# Patient Record
Sex: Female | Born: 1955 | Race: White | Hispanic: No | Marital: Single | Smoking: Never smoker
Health system: Southern US, Community
[De-identification: ages and names within clinical notes are randomized; demographics above are authoritative.]

## PROBLEM LIST (undated history)

## (undated) DIAGNOSIS — J45909 Unspecified asthma, uncomplicated: Secondary | ICD-10-CM

## (undated) DIAGNOSIS — C801 Malignant (primary) neoplasm, unspecified: Secondary | ICD-10-CM

## (undated) DIAGNOSIS — I10 Essential (primary) hypertension: Secondary | ICD-10-CM

## (undated) HISTORY — PX: BREAST SURGERY: SHX581

---

## 2012-02-08 ENCOUNTER — Emergency Department: Payer: Self-pay | Admitting: Emergency Medicine

## 2016-02-01 ENCOUNTER — Emergency Department: Payer: Medicaid Other

## 2016-02-01 ENCOUNTER — Encounter: Payer: Self-pay | Admitting: Emergency Medicine

## 2016-02-01 ENCOUNTER — Emergency Department
Admission: EM | Admit: 2016-02-01 | Discharge: 2016-02-01 | Disposition: A | Discharge status: Home / Self Care | Payer: Medicaid Other | Attending: Emergency Medicine | Admitting: Emergency Medicine

## 2016-02-01 DIAGNOSIS — I10 Essential (primary) hypertension: Secondary | ICD-10-CM | POA: Diagnosis not present

## 2016-02-01 DIAGNOSIS — I890 Lymphedema, not elsewhere classified: Secondary | ICD-10-CM | POA: Diagnosis not present

## 2016-02-01 DIAGNOSIS — J45909 Unspecified asthma, uncomplicated: Secondary | ICD-10-CM | POA: Diagnosis not present

## 2016-02-01 DIAGNOSIS — R2232 Localized swelling, mass and lump, left upper limb: Secondary | ICD-10-CM | POA: Diagnosis present

## 2016-02-01 HISTORY — DX: Essential (primary) hypertension: I10

## 2016-02-01 HISTORY — DX: Unspecified asthma, uncomplicated: J45.909

## 2016-02-01 LAB — COMPREHENSIVE METABOLIC PANEL
ALK PHOS: 91 U/L (ref 38–126)
ALT: 17 U/L (ref 14–54)
ANION GAP: 4 — AB (ref 5–15)
AST: 34 U/L (ref 15–41)
Albumin: 4 g/dL (ref 3.5–5.0)
BUN: 18 mg/dL (ref 6–20)
CALCIUM: 8.6 mg/dL — AB (ref 8.9–10.3)
CHLORIDE: 106 mmol/L (ref 101–111)
CO2: 27 mmol/L (ref 22–32)
CREATININE: 0.92 mg/dL (ref 0.44–1.00)
Glucose, Bld: 101 mg/dL — ABNORMAL HIGH (ref 65–99)
Potassium: 3.3 mmol/L — ABNORMAL LOW (ref 3.5–5.1)
SODIUM: 137 mmol/L (ref 135–145)
Total Bilirubin: 0.8 mg/dL (ref 0.3–1.2)
Total Protein: 7.6 g/dL (ref 6.5–8.1)

## 2016-02-01 LAB — CBC
HEMATOCRIT: 35.9 % (ref 35.0–47.0)
HEMOGLOBIN: 11.9 g/dL — AB (ref 12.0–16.0)
MCH: 29.4 pg (ref 26.0–34.0)
MCHC: 33 g/dL (ref 32.0–36.0)
MCV: 89 fL (ref 80.0–100.0)
Platelets: 121 10*3/uL — ABNORMAL LOW (ref 150–440)
RBC: 4.04 MIL/uL (ref 3.80–5.20)
RDW: 16.1 % — AB (ref 11.5–14.5)
WBC: 6.9 10*3/uL (ref 3.6–11.0)

## 2016-02-01 MED ORDER — TRAMADOL HCL 50 MG PO TABS: 50.0000 mg | ORAL_TABLET | Freq: Four times a day (QID) | ORAL | Status: DC | PRN

## 2016-02-01 MED ORDER — TRAMADOL HCL 50 MG PO TABS
50.0000 mg | ORAL_TABLET | Freq: Once | ORAL | Status: AC
  Administered 2016-02-01: 50 mg via ORAL
  Filled 2016-02-01: qty 1

## 2016-02-01 NOTE — Discharge Instructions (Signed)
Lymphedema  Lymphedema is swelling that is caused by the abnormal collection of lymph under the skin. Lymph is fluid from the tissues in your body that travels in the lymphatic system. This system is part of the immune system and includes lymph nodes and lymph vessels. The lymph vessels collect and carry the excess fluid, fats, proteins, and wastes from the tissues of the body to the bloodstream. This system also works to clean and remove bacteria and waste products from the body.  Lymphedema occurs when the lymphatic system is blocked. When the lymph vessels or lymph nodes are blocked or damaged, lymph does not drain properly, causing an abnormal buildup of lymph. This leads to swelling in the arms or legs. Lymphedema cannot be cured by medicines, but various methods can be used to help reduce the swelling.  CAUSES  There are two types of lymphedema. Primary lymphedema is caused by the absence or abnormality of the lymph vessel at birth. Secondary lymphedema is more common. It occurs when the lymph vessel is damaged or blocked. Common causes of lymph vessel blockage include:  · Skin infection, such as cellulitis.  · Infection by parasites (filariasis).  · Injury.  · Cancer.  · Radiation therapy.  · Formation of scar tissue.  · Surgery.  SYMPTOMS  Symptoms of this condition include:  · Swelling of the arm or leg.  · A heavy or tight feeling in the arm or leg.  · Swelling of the feet, toes, or fingers. Shoes or rings may fit more tightly than before.  · Redness of the skin over the affected area.  · Limited movement of the affected limb.  · Sensitivity to touch or discomfort in the affected limb.  DIAGNOSIS  This condition may be diagnosed with:  · A physical exam.  · Medical history.  · Imaging tests, such as:    Lymphoscintigraphy. In this test, a low dose of a radioactive substance is injected to trace the flow of lymph through the lymph vessels.    MRI.    CT scan.    Duplex ultrasound. This test uses sound waves  to produce images of the vessels and the blood flow on a screen.    Lymphangiography. In this test, a contrast dye is injected into the lymph vessel to help show blockages.  TREATMENT  Treatment for this condition may depend on the cause. Treatment may include:  · Exercise. Certain exercises can help fluid move out of the affected limb.  · Massage. Gentle massage of the affected limb can help move the fluid out of the area.  · Compression. Various methods may be used to apply pressure to the affected limb in order to reduce the swelling.    Wearing compression stockings or sleeves on the affected limb.    Bandaging the affected limb.    Using an external pump that is attached to a sleeve that alternates between applying pressure and releasing pressure.  · Surgery. This is usually only done for severe cases. For example, surgery may be done if you have trouble moving the limb or if the swelling does not get better with other treatments.  If an underlying condition is causing the lymphedema, treatment for that condition is needed. For example, antibiotic medicines may be used to treat an infection.  HOME CARE INSTRUCTIONS  Activities  · Exercise regularly as directed by your health care provider.  · Do not sit with your legs crossed.  · When possible, keep the affected limb   raised (elevated) above the level of your heart.  · Avoid carrying things with an arm that is affected by lymphedema.  · Remember that the affected area is more likely to become injured or infected.  · Take these steps to help prevent infection:    Keep the affected area clean and dry.    Protect your skin from cuts. For example, you should use gloves while cooking or gardening. Do not walk barefoot. If you shave the affected area, use an electric razor.  General Instructions  · Take medicines only as directed by your health care provider.  · Eat a healthy diet that includes a lot of fruits and vegetables.  · Do not wear tight clothes, shoes, or  jewelry.  · Do not use heating pads over the affected area.  · Avoid having blood pressure checked on the affected limb.  · Keep all follow-up visits as directed by your health care provider. This is important.  SEEK MEDICAL CARE IF:  · You continue to have swelling in your limb.  · You have a fever.  · You have a cut that does not heal.  · You have redness or pain in the affected area.  · You have new swelling in your limb that comes on suddenly.  · You develop purplish spots or sores (lesions) on your limb.  SEEK IMMEDIATE MEDICAL CARE IF:  · You have a skin rash.  · You have chills or sweats.  · You have shortness of breath.     This information is not intended to replace advice given to you by your health care provider. Make sure you discuss any questions you have with your health care provider.     Document Released: 08/17/2007 Document Revised: 03/06/2015 Document Reviewed: 09/27/2014  Elsevier Interactive Patient Education ©2016 [employer]

## 2016-02-01 NOTE — ED Notes (Signed)
Pt to ed with c/o left arm swelling x 6 months.  Pt states her family made her come today.  Reports pain in left arm. +pulse, movement and sensation intact.

## 2016-02-01 NOTE — ED Provider Notes (Signed)
[facility] Emergency Department Provider Note  ____________________________________________    I have reviewed the triage vital signs and the nursing notes.   HISTORY  Chief Complaint Arm Swelling    HPI Charlotte Tran is a 60 y.o. female who presents with complaints of left arm swelling and discomfort. She reports her left arm has been swelling diffusely for approximately the last 6 months. Her family made her come to the emergency room today. She does complain of discomfort diffusely but no areas of redness or discharge. No history of blood clots. No recent travel. No fevers or chills. She does have a history of a left-sided mastectomy     Past Medical History  Diagnosis Date  . Hypertension   . Asthma     There are no active problems to display for this patient.   History reviewed. No pertinent past surgical history.  No current outpatient prescriptions on file.  Allergies Aspirin and Codeine  History reviewed. No pertinent family history.  Social History Social History  Substance Use Topics  . Smoking status: Never Smoker   . Smokeless tobacco: None  . Alcohol Use: Yes    Review of Systems  Constitutional: Negative for fever. Eyes: Negative for redness ENT: Negative for sore throat Cardiovascular: Negative for chest pain Respiratory: Negative for shortness of breath. Gastrointestinal: Negative for abdominal pain Genitourinary: Negative for dysuria. Musculoskeletal: Negative for back pain. Left arm swelling as above Skin: Negative for rash. Neurological: Negative for focal weakness Psychiatric: no anxiety    ____________________________________________   PHYSICAL EXAM:  VITAL SIGNS: ED Triage Vitals  Enc Vitals Group     BP 02/01/16 1334 190/77 mmHg     Pulse Rate 02/01/16 1334 100     Resp 02/01/16 1334 20     Temp 02/01/16 1334 98.4 F (36.9 C)     Temp Source 02/01/16 1334 Oral     SpO2 02/01/16 1334 100 %   Weight 02/01/16 1334 140 lb (63.504 kg)     Height 02/01/16 1334 5\' 6"  (1.676 m)     Head Cir --      Peak Flow --      Pain Score 02/01/16 1335 10     Pain Loc --      Pain Edu? --      Excl. in [place]? --      Constitutional: Alert and oriented. Well appearing and in no distress.  Eyes: Conjunctivae are normal. No erythema or injection ENT   Head: Normocephalic and atraumatic.   Mouth/Throat: Mucous membranes are moist. Cardiovascular: Normal rate, regular rhythm. Normal and symmetric distal pulses are present in the upper extremities. No murmurs or rubs  Respiratory: Normal respiratory effort without tachypnea nor retractions. Breath sounds are clear and equal bilaterally.  Gastrointestinal: Soft and non-tender in all quadrants. No distention. There is no CVA tenderness. Genitourinary: deferred Musculoskeletal: Nontender with normal range of motion in all extremities. No lower extremity tenderness nor edema. Left arm is diffusely edematous. No evidence of cellulitis. 2+ distal pulses, normal cap refill. Compartments are soft. Neurologic:  Normal speech and language. No gross focal neurologic deficits are appreciated. Skin:  Skin is warm, dry and intact. No rash noted. Psychiatric: Mood and affect are normal. Patient exhibits appropriate insight and judgment.  ____________________________________________    LABS (pertinent positives/negatives)  Labs Reviewed  CBC - Abnormal; Notable for the following:    Hemoglobin 11.9 (*)    RDW 16.1 (*)    Platelets 121 (*)  All other components within normal limits  COMPREHENSIVE METABOLIC PANEL - Abnormal; Notable for the following:    Potassium 3.3 (*)    Glucose, Bld 101 (*)    Calcium 8.6 (*)    Anion gap 4 (*)    All other components within normal limits    ____________________________________________   EKG  None  ____________________________________________    RADIOLOGY  Ultrasound of left upper extremity  negative for DVT  ____________________________________________   PROCEDURES  Procedure(s) performed: none  Critical Care performed:none  ____________________________________________   INITIAL IMPRESSION / ASSESSMENT AND PLAN / ED COURSE  Pertinent labs & imaging results that were available during my care of the patient were reviewed by me and considered in my medical decision making (see chart for details).  Given history of mastectomy on the left and gradual worsening of left arm swelling this appears consistent with lymphedema. We will check ultrasound to rule out DVT  ____________________________________________   FINAL CLINICAL IMPRESSION(S) / ED DIAGNOSES  Final diagnoses:  Lymphedema of arm          Lavonia Drafts, MD 02/01/16 2241

## 2016-04-18 ENCOUNTER — Encounter: Payer: Self-pay | Admitting: Emergency Medicine

## 2016-04-18 ENCOUNTER — Emergency Department: Payer: Medicaid Other

## 2016-04-18 ENCOUNTER — Emergency Department
Admission: EM | Admit: 2016-04-18 | Discharge: 2016-04-18 | Disposition: A | Discharge status: Home / Self Care | Payer: Medicaid Other | Attending: Emergency Medicine | Admitting: Emergency Medicine

## 2016-04-18 DIAGNOSIS — J45909 Unspecified asthma, uncomplicated: Secondary | ICD-10-CM | POA: Insufficient documentation

## 2016-04-18 DIAGNOSIS — Z79899 Other long term (current) drug therapy: Secondary | ICD-10-CM | POA: Insufficient documentation

## 2016-04-18 DIAGNOSIS — I10 Essential (primary) hypertension: Secondary | ICD-10-CM | POA: Insufficient documentation

## 2016-04-18 DIAGNOSIS — M25561 Pain in right knee: Secondary | ICD-10-CM | POA: Insufficient documentation

## 2016-04-18 DIAGNOSIS — Z859 Personal history of malignant neoplasm, unspecified: Secondary | ICD-10-CM | POA: Insufficient documentation

## 2016-04-18 DIAGNOSIS — L03114 Cellulitis of left upper limb: Secondary | ICD-10-CM | POA: Insufficient documentation

## 2016-04-18 DIAGNOSIS — R2232 Localized swelling, mass and lump, left upper limb: Secondary | ICD-10-CM | POA: Diagnosis present

## 2016-04-18 DIAGNOSIS — M7989 Other specified soft tissue disorders: Secondary | ICD-10-CM

## 2016-04-18 HISTORY — DX: Malignant (primary) neoplasm, unspecified: C80.1

## 2016-04-18 LAB — CBC WITH DIFFERENTIAL/PLATELET
BASOS ABS: 0.1 10*3/uL (ref 0–0.1)
EOS ABS: 0.2 10*3/uL (ref 0–0.7)
HEMATOCRIT: 40.8 % (ref 35.0–47.0)
Hemoglobin: 13.4 g/dL (ref 12.0–16.0)
Lymphocytes Relative: 17 %
Lymphs Abs: 1.3 10*3/uL (ref 1.0–3.6)
MCH: 30 pg (ref 26.0–34.0)
MCHC: 33 g/dL (ref 32.0–36.0)
MCV: 91.1 fL (ref 80.0–100.0)
MONO ABS: 0.4 10*3/uL (ref 0.2–0.9)
NEUTROS ABS: 5.6 10*3/uL (ref 1.4–6.5)
Neutrophils Relative %: 74 %
PLATELETS: 84 10*3/uL — AB (ref 150–440)
RBC: 4.48 MIL/uL (ref 3.80–5.20)
RDW: 15.1 % — AB (ref 11.5–14.5)
WBC: 7.6 10*3/uL (ref 3.6–11.0)

## 2016-04-18 LAB — URINALYSIS COMPLETE WITH MICROSCOPIC (ARMC ONLY)
BILIRUBIN URINE: NEGATIVE
GLUCOSE, UA: NEGATIVE mg/dL
HGB URINE DIPSTICK: NEGATIVE
KETONES UR: NEGATIVE mg/dL
NITRITE: POSITIVE — AB
Protein, ur: NEGATIVE mg/dL
RBC / HPF: NONE SEEN RBC/hpf (ref 0–5)
Specific Gravity, Urine: 1.006 (ref 1.005–1.030)
pH: 5 (ref 5.0–8.0)

## 2016-04-18 LAB — BASIC METABOLIC PANEL
ANION GAP: 12 (ref 5–15)
BUN: 9 mg/dL (ref 6–20)
CALCIUM: 9.1 mg/dL (ref 8.9–10.3)
CO2: 24 mmol/L (ref 22–32)
CREATININE: 0.94 mg/dL (ref 0.44–1.00)
Chloride: 105 mmol/L (ref 101–111)
Glucose, Bld: 97 mg/dL (ref 65–99)
Potassium: 2.8 mmol/L — CL (ref 3.5–5.1)
Sodium: 141 mmol/L (ref 135–145)

## 2016-04-18 MED ORDER — POTASSIUM CHLORIDE 20 MEQ/15ML (10%) PO SOLN: 40.0000 meq | Freq: Once | ORAL | Status: DC

## 2016-04-18 MED ORDER — POTASSIUM CHLORIDE CRYS ER 20 MEQ PO TBCR
40.0000 meq | EXTENDED_RELEASE_TABLET | Freq: Once | ORAL | Status: AC
  Administered 2016-04-18: 40 meq via ORAL

## 2016-04-18 MED ORDER — POTASSIUM CHLORIDE ER 10 MEQ PO TBCR: 10.0000 meq | EXTENDED_RELEASE_TABLET | Freq: Every day | ORAL | Status: AC

## 2016-04-18 MED ORDER — POTASSIUM CHLORIDE CRYS ER 20 MEQ PO TBCR
EXTENDED_RELEASE_TABLET | ORAL | Status: AC
  Administered 2016-04-18: 40 meq via ORAL
  Filled 2016-04-18: qty 2

## 2016-04-18 MED ORDER — DOXYCYCLINE HYCLATE 100 MG PO CAPS: 100.0000 mg | ORAL_CAPSULE | Freq: Two times a day (BID) | ORAL | Status: DC

## 2016-04-18 MED ORDER — SODIUM CHLORIDE 0.9 % IV BOLUS (SEPSIS)
500.0000 mL | Freq: Once | INTRAVENOUS | Status: AC
  Administered 2016-04-18: 500 mL via INTRAVENOUS

## 2016-04-18 MED ORDER — TRAMADOL HCL 50 MG PO TABS
50.0000 mg | ORAL_TABLET | ORAL | Status: AC
  Administered 2016-04-18: 50 mg via ORAL
  Filled 2016-04-18: qty 1

## 2016-04-18 MED ORDER — TRAMADOL HCL 50 MG PO TABS: 50.0000 mg | ORAL_TABLET | Freq: Four times a day (QID) | ORAL | Status: AC | PRN

## 2016-04-18 NOTE — ED Notes (Signed)
Pt c/o right knee pain, worse with mov't X 2 months. Pt states she could not take pain anymore and came for eval today. Pt hx of fibromyalgia and states that she "moves a lot, walk 5 miles a day" in order to reduce pain from fibromyalgia. Mild swelling to outer right knee present, no redness; tenderness upon palpation. Pt c/o progressive weakness, belives that it is due to knee pain. Pt also has significant swelling to left arm; breast removal X 10 years ago to left side. Pt states she has chronic intermittent swelling to left arm, swollen X 1 month this occurrence, denies pain.

## 2016-04-18 NOTE — ED Notes (Signed)
Report given to Grace RN

## 2016-04-18 NOTE — ED Notes (Signed)
Blistered rash to left chest and becomes more patchy as it goes down her left arm  Pt reports 10/10 right sided knee pain

## 2016-04-18 NOTE — ED Provider Notes (Signed)
[facility] Emergency Department Provider Note  ____________________________________________  Time seen: Approximately 10:22 AM  I have reviewed the triage vital signs and the nursing notes.   HISTORY  Chief Complaint Leg Pain    HPI Charlotte Tran is a 60 y.o. female reports she's been having pain at the lower portion of the right knee for about 2 months. It hurts to walk on and she has to use a cane. She was using tramadol for this, and reports that this does help her. No swelling in the knee. No fevers or chills. No skin changes redness or swelling.  No trauma or injury. States a achy pain, severe when trying to stand upon it. No hip pain, no pain in the lower leg or foot. No numbness or tingling.   Past Medical History  Diagnosis Date  . Hypertension   . Asthma   . Cancer ([place])     There are no active problems to display for this patient.   Past Surgical History  Procedure Laterality Date  . Breast surgery      Current Outpatient Rx  Name  Route  Sig  Dispense  Refill  . lisinopril (PRINIVIL,ZESTRIL) 40 MG tablet   Oral   Take 40 mg by mouth daily.         Marland Kitchen doxycycline (VIBRAMYCIN) 100 MG capsule   Oral   Take 1 capsule (100 mg total) by mouth 2 (two) times daily.   20 capsule   0   . potassium chloride (K-DUR) 10 MEQ tablet   Oral   Take 1 tablet (10 mEq total) by mouth daily.   5 tablet   0   . traMADol (ULTRAM) 50 MG tablet   Oral   Take 1 tablet (50 mg total) by mouth every 6 (six) hours as needed.   30 tablet   0     Allergies Aspirin and Codeine  No family history on file.  Social History Social History  Substance Use Topics  . Smoking status: Never Smoker   . Smokeless tobacco: None  . Alcohol Use: Yes    Review of Systems Constitutional: No fever/chills Eyes: No visual changes. ENT: No sore throat. Cardiovascular: Denies chest pain. Respiratory: Denies shortness of breath. Gastrointestinal: No  abdominal pain.  No nausea, no vomiting.  No diarrhea.  No constipation. Genitourinary: Negative for dysuria. Musculoskeletal: Negative for back pain. Skin: Negative for except the patient noticed over the last month increasing redness over the left upper chest, and also some swelling into the left arm. She's had swelling in the left arm off and on for 10 years, but she reports that this rash appeared after they sprayed for some sort of insect at her boarding house a month or 2 ago. He has been getting red, and she is also noticed that it has some ulcers. Neurological: Negative for headaches, focal weakness or numbness.  10-point ROS otherwise negative.  ____________________________________________   PHYSICAL EXAM:  VITAL SIGNS: ED Triage Vitals  Enc Vitals Group     BP 04/18/16 0903 179/93 mmHg     Pulse Rate 04/18/16 0903 116     Resp 04/18/16 0903 20     Temp 04/18/16 0903 98.6 F (37 C)     Temp Source 04/18/16 0903 Oral     SpO2 04/18/16 0903 99 %     Weight 04/18/16 0903 120 lb (54.432 kg)     Height 04/18/16 0903 5\' 6"  (1.676 m)     Head  Cir --      Peak Flow --      Pain Score 04/18/16 0903 10     Pain Loc --      Pain Edu? --      Excl. in [place]? --    Constitutional: Alert and oriented. Well appearing and in no acute distress.Very pleasant. Eyes: Conjunctivae are normal. PERRL. EOMI. Head: Atraumatic. Nose: No congestion/rhinnorhea. Mouth/Throat: Mucous membranes are moist.  Oropharynx non-erythematous. Neck: No stridor.   Cardiovascular: Normal rate, regular rhythm. Grossly normal heart sounds.  Good peripheral circulation. Respiratory: Normal respiratory effort.  No retractions. Lungs CTAB. Gastrointestinal: Soft and nontender. No distention. No abdominal bruits. No CVA tenderness. Musculoskeletal:   Lower Extremities  No edema. Normal DP/PT pulses bilateral with good cap refill.  Normal neuro-motor function lower extremities bilateral.  RIGHT Right lower  extremity demonstrates normal strength, good use of all muscles. No edema bruising or contusions of the right hip, right knee, right ankle. Full range of motion of the right lower extremity without pain except some over the right lateral knee joint. No pain on axial loading. No evidence of trauma. There is no effusion, overlying skin changes or erythema noted at the right knee. She does have focal tenderness just on the lateral inferior portion of the right knee joint.  LEFT Left lower extremity demonstrates normal strength, good use of all muscles. No edema bruising or contusions of the hip,  knee, ankle. Full range of motion of the left lower extremity without pain. No pain on axial loading. No evidence of trauma.   Neurologic:  Normal speech and language. No gross focal neurologic deficits are appreciated. No gait instability. Skin:  Skin is warm, dry and intact except the left upper extremity has moderate circumferential pitting edema, some mild blanching erythema over the forearm, and then also has a rather tense, shiny, area of erythema and slight pitting over the left upper chest wall, this is also associated with a area about the size of the thumb pad with ulceration which is shallow with extending erythema. Psychiatric: Mood and affect are normal. Speech and behavior are normal.  ____________________________________________   LABS (all labs ordered are listed, but only abnormal results are displayed)  Labs Reviewed  CBC WITH DIFFERENTIAL/PLATELET - Abnormal; Notable for the following:    RDW 15.1 (*)    Platelets 84 (*)    All other components within normal limits  BASIC METABOLIC PANEL - Abnormal; Notable for the following:    Potassium 2.8 (*)    All other components within normal limits  URINALYSIS COMPLETEWITH MICROSCOPIC ([facility] ONLY) - Abnormal; Notable for the following:    Color, Urine COLORLESS (*)    APPearance CLEAR (*)    Nitrite POSITIVE (*)    Leukocytes, UA 2+ (*)     Bacteria, UA FEW (*)    Squamous Epithelial / LPF 0-5 (*)    All other components within normal limits   ____________________________________________  EKG  Reviewed and interpreted by me at 9:15 AM Sinus tachycardia No acute evidence of ischemic abnormality. Heart rate 110 QRS 90 QTc 450  ____________________________________________  RADIOLOGY   DG Knee Complete 4 Views Right (Final result) Result time: 04/18/16 13:05:50   Final result by Rad Results In Interface (04/18/16 13:05:50)   Narrative:   CLINICAL DATA: Progressive pain. No history of recent trauma. History of breast carcinoma  EXAM: RIGHT KNEE - COMPLETE 4+ VIEW  COMPARISON: None.  FINDINGS: Frontal, lateral, and bilateral oblique views were  obtained. There is no fracture or dislocation. No joint effusion. The joint spaces appear normal. No erosive change. No blastic or lytic bone lesions.  IMPRESSION: No fracture or dislocation. No joint effusion. No appreciable arthropathy.   Electronically Signed By: Lowella Grip III M.D. On: 04/18/2016 13:05          DG Chest 2 View (Final result) Result time: 04/18/16 13:09:09   Final result by Rad Results In Interface (04/18/16 13:09:09)   Narrative:   CLINICAL DATA: History of breast carcinoma with left upper extremity edema  EXAM: CHEST 2 VIEW  COMPARISON: February 08, 2012  FINDINGS: There is atelectatic change in the left base. A small focus of pneumonia in the left base cannot be excluded. There is also a rather minimal left pleural effusion. The lungs elsewhere are clear. Heart size and pulmonary vascularity are normal. No adenopathy. Port-A-Cath tip is in the superior vena cava. No pneumothorax. There is arthropathy in the right shoulder.  There is a defect in the right lateral fifth rib of with soft tissue prominence in this area. Patient is status post left mastectomy.  IMPRESSION: There is absence of the lateral right  fifth rib with soft tissue fullness in this area. Neoplastic etiology in this area must be of concern given the current appearance. Patient is status post left mastectomy. Atelectasis with questionable early infiltrate left base. Minimal left pleural effusion. Lungs elsewhere clear. Stable cardiac silhouette.   Electronically Signed By: Lowella Grip III M.D. On: 04/18/2016 13:09          US Venous Img Upper Uni Left (Final result) Result time: 04/18/16 11:33:14   Procedure changed from [place] Extrem Up Left Comp      Final result by Rad Results In Interface (04/18/16 11:33:14)   Narrative:   CLINICAL DATA: Chronic left upper extremity edema. History of previous radiation from left mastectomy  EXAM: LEFT UPPER EXTREMITY VENOUS DUPLEX ULTRASOUND  TECHNIQUE: Gray-scale sonography with graded compression, as well as color Doppler and duplex ultrasound were performed to evaluate the left upper extremity deep venous system from the level of the subclavian vein and including the jugular, axillary, basilic, radial, ulnar and upper cephalic vein. Spectral Doppler was utilized to evaluate flow at rest and with distal augmentation maneuvers.  COMPARISON: February 01, 2016  FINDINGS: Contralateral Subclavian Vein: Respiratory phasicity is normal and symmetric with the symptomatic side. No evidence of thrombus. Normal compressibility.  Internal Jugular Vein: No evidence of thrombus. Normal compressibility, respiratory phasicity and response to augmentation.  Subclavian Vein: No evidence of thrombus. Normal compressibility, respiratory phasicity and response to augmentation.  Axillary Vein: No evidence of thrombus. Normal compressibility, respiratory phasicity and response to augmentation.  Cephalic Vein: No evidence of thrombus. Normal compressibility, respiratory phasicity and response to augmentation.  Basilic Vein: No evidence of thrombus. Normal  compressibility, respiratory phasicity and response to augmentation.  Brachial Veins: No evidence of thrombus. Normal compressibility, respiratory phasicity and response to augmentation.  Radial Veins: No evidence of thrombus. Normal compressibility, respiratory phasicity and response to augmentation.  Ulnar Veins: No evidence of thrombus. Normal compressibility, respiratory phasicity and response to augmentation.  Venous Reflux: None visualized.  Other Findings: None visualized.  IMPRESSION: No evidence of left upper extremity deep venous thrombosis. Right subclavian vein also patent.   Electronically Signed By: Lowella Grip III M.D. On: 04/18/2016 11:33    ____________________________________________   PROCEDURES  Procedure(s) performed: None  Critical Care performed: No  ____________________________________________   INITIAL IMPRESSION / ASSESSMENT AND  PLAN / ED COURSE  Pertinent labs & imaging results that were available during my care of the patient were reviewed by me and considered in my medical decision making (see chart for details).    1) Somewhat chronic right knee pain. Suspect likely due to osteoarthritic changes. No evidence of superinfection, major trauma, or immediate ligamentous or other injury.  2) Patient did not mention as chief complaint, but she has significant edema erythema and a cellulitic-type lesion overlying her left upper chest wall into the arm. Differential includes cellulitis due to skin ulceration, or edema and erythema extending down however also consider the possibility of skin cancer or other malignant process.  ----------------------------------------- 2:35 PM on 04/18/2016 -----------------------------------------  Discussed with the patient that her findings today seem to indicate high concern for worsening cancer which may account for her skin lesions as well. I talked with the patient, she tells me that she was  diagnosed in [place] with stage IV breast cancer, but she never wanted and never received chemotherapy. She is comfortable with this and she reports that she would never want to seek treatment for her cancer. I did discuss with her and she is agreeable to following up with oncology to further evaluate and see if indeed this is cancer that is causing her skin lesion, and she indicates she would be willing to do that but never willing to have surgery, chemotherapy, or radiation. He told her that she should have a discussion with oncology and close follow-up and she agrees.  I will place her on doxycycline as I suspect that there is an element of superinfection overlying her cellulitic and somewhat indurating skin lesion over the left upper arm and chest. I discussed careful return precautions, and even discussed potential admission with the patient but she states that she does not want to stay in the hospital and that she would follow up closely with her doctor. I think this is reasonable, her goals of care seem to be that of not treating any active cancer, and we have given her appropriate follow-up information.  Return precautions and treatment recommendations and follow-up discussed with the patient who is agreeable with the plan.  ____________________________________________   FINAL CLINICAL IMPRESSION(S) / ED DIAGNOSES  Final diagnoses:  Cellulitis of left upper arm  Right knee pain      Delman Kitten, MD 04/18/16 1526

## 2016-04-18 NOTE — ED Notes (Signed)
Patient transported to X-ray 

## 2016-04-18 NOTE — ED Notes (Signed)
Pt complaining of progressive weakness and pain to right leg, pt walks with a cane and finding it difficult to be steady with it .

## 2016-04-18 NOTE — Discharge Instructions (Signed)
You have been seen today in the Emergency Department (ED) for cellulitis, a superficial skin infection. Please take your antibiotics as prescribed for their ENTIRE prescribed duration.    I am concerned that he may have return of your cancer causing your skin eruption, but you indicated that you do not wish any further treatment. I do want you to follow-up with oncology and your primary care doctor soon.  Please follow up with your doctor AND oncology or in the ED in 24-48 hours for recheck of your infection if you are not improving.  Call your doctor sooner or return to the ED if you develop worsening signs of infection such as: increased redness, increased pain, pus, fever, or other symptoms that concern you.   Cellulitis Cellulitis is an infection of the skin and the tissue beneath it. The infected area is usually red and tender. Cellulitis occurs most often in the arms and lower legs.  CAUSES  Cellulitis is caused by bacteria that enter the skin through cracks or cuts in the skin. The most common types of bacteria that cause cellulitis are staphylococci and streptococci. SIGNS AND SYMPTOMS   Redness and warmth.  Swelling.  Tenderness or pain.  Fever. DIAGNOSIS  Your health care provider can usually determine what is wrong based on a physical exam. Blood tests may also be done. TREATMENT  Treatment usually involves taking an antibiotic medicine. HOME CARE INSTRUCTIONS   Take your antibiotic medicine as directed by your health care provider. Finish the antibiotic even if you start to feel better.  Keep the infected arm or leg elevated to reduce swelling.  Apply a warm cloth to the affected area up to 4 times per day to relieve pain.  Take medicines only as directed by your health care provider.  Keep all follow-up visits as directed by your health care provider. SEEK MEDICAL CARE IF:   You notice red streaks coming from the infected area.  Your red area gets larger or turns  dark in color.  Your bone or joint underneath the infected area becomes painful after the skin has healed.  Your infection returns in the same area or another area.  You notice a swollen bump in the infected area.  You develop new symptoms.  You have a fever. SEEK IMMEDIATE MEDICAL CARE IF:   You feel very sleepy.  You develop vomiting or diarrhea.  You have a general ill feeling (malaise) with muscle aches and pains.   This information is not intended to replace advice given to you by your health care provider. Make sure you discuss any questions you have with your health care provider.   Document Released: 07/30/2005 Document Revised: 07/11/2015 Document Reviewed: 01/05/2012 Elsevier Interactive Patient Education [employer].

## 2016-06-09 ENCOUNTER — Encounter: Payer: Self-pay | Admitting: Emergency Medicine

## 2016-06-09 ENCOUNTER — Emergency Department: Payer: Medicaid Other

## 2016-06-09 ENCOUNTER — Inpatient Hospital Stay
Admission: EM | Admit: 2016-06-09 | Discharge: 2016-07-04 | DRG: 682 | Disposition: E | Payer: Medicaid Other | Attending: Internal Medicine | Admitting: Internal Medicine

## 2016-06-09 DIAGNOSIS — L989 Disorder of the skin and subcutaneous tissue, unspecified: Secondary | ICD-10-CM

## 2016-06-09 DIAGNOSIS — Z9221 Personal history of antineoplastic chemotherapy: Secondary | ICD-10-CM

## 2016-06-09 DIAGNOSIS — R000 Tachycardia, unspecified: Secondary | ICD-10-CM | POA: Diagnosis present

## 2016-06-09 DIAGNOSIS — J9600 Acute respiratory failure, unspecified whether with hypoxia or hypercapnia: Secondary | ICD-10-CM | POA: Diagnosis present

## 2016-06-09 DIAGNOSIS — L539 Erythematous condition, unspecified: Secondary | ICD-10-CM | POA: Diagnosis present

## 2016-06-09 DIAGNOSIS — I890 Lymphedema, not elsewhere classified: Secondary | ICD-10-CM | POA: Diagnosis present

## 2016-06-09 DIAGNOSIS — Z515 Encounter for palliative care: Secondary | ICD-10-CM

## 2016-06-09 DIAGNOSIS — N390 Urinary tract infection, site not specified: Secondary | ICD-10-CM | POA: Diagnosis present

## 2016-06-09 DIAGNOSIS — J45909 Unspecified asthma, uncomplicated: Secondary | ICD-10-CM | POA: Diagnosis present

## 2016-06-09 DIAGNOSIS — I10 Essential (primary) hypertension: Secondary | ICD-10-CM | POA: Diagnosis present

## 2016-06-09 DIAGNOSIS — C50919 Malignant neoplasm of unspecified site of unspecified female breast: Secondary | ICD-10-CM | POA: Diagnosis present

## 2016-06-09 DIAGNOSIS — J189 Pneumonia, unspecified organism: Secondary | ICD-10-CM | POA: Diagnosis present

## 2016-06-09 DIAGNOSIS — Y842 Radiological procedure and radiotherapy as the cause of abnormal reaction of the patient, or of later complication, without mention of misadventure at the time of the procedure: Secondary | ICD-10-CM | POA: Diagnosis present

## 2016-06-09 DIAGNOSIS — Z923 Personal history of irradiation: Secondary | ICD-10-CM

## 2016-06-09 DIAGNOSIS — L89322 Pressure ulcer of left buttock, stage 2: Secondary | ICD-10-CM | POA: Diagnosis present

## 2016-06-09 DIAGNOSIS — R0902 Hypoxemia: Secondary | ICD-10-CM | POA: Diagnosis present

## 2016-06-09 DIAGNOSIS — R0602 Shortness of breath: Secondary | ICD-10-CM | POA: Diagnosis not present

## 2016-06-09 DIAGNOSIS — N170 Acute kidney failure with tubular necrosis: Principal | ICD-10-CM | POA: Diagnosis present

## 2016-06-09 DIAGNOSIS — Z886 Allergy status to analgesic agent status: Secondary | ICD-10-CM | POA: Diagnosis not present

## 2016-06-09 DIAGNOSIS — E860 Dehydration: Secondary | ICD-10-CM | POA: Diagnosis present

## 2016-06-09 DIAGNOSIS — Z9010 Acquired absence of unspecified breast and nipple: Secondary | ICD-10-CM | POA: Diagnosis not present

## 2016-06-09 DIAGNOSIS — N179 Acute kidney failure, unspecified: Secondary | ICD-10-CM | POA: Diagnosis not present

## 2016-06-09 DIAGNOSIS — M797 Fibromyalgia: Secondary | ICD-10-CM | POA: Diagnosis present

## 2016-06-09 DIAGNOSIS — L309 Dermatitis, unspecified: Secondary | ICD-10-CM | POA: Diagnosis present

## 2016-06-09 DIAGNOSIS — Z79899 Other long term (current) drug therapy: Secondary | ICD-10-CM

## 2016-06-09 DIAGNOSIS — Z66 Do not resuscitate: Secondary | ICD-10-CM | POA: Diagnosis present

## 2016-06-09 DIAGNOSIS — G8929 Other chronic pain: Secondary | ICD-10-CM | POA: Diagnosis present

## 2016-06-09 DIAGNOSIS — L8990 Pressure ulcer of unspecified site, unspecified stage: Secondary | ICD-10-CM | POA: Diagnosis present

## 2016-06-09 LAB — CBC WITH DIFFERENTIAL/PLATELET
Basophils Absolute: 0 10*3/uL (ref 0–0.1)
Basophils Relative: 1 %
Eosinophils Absolute: 0 10*3/uL (ref 0–0.7)
Eosinophils Relative: 0 %
HCT: 45.6 % (ref 35.0–47.0)
HEMOGLOBIN: 15.4 g/dL (ref 12.0–16.0)
LYMPHS ABS: 1.1 10*3/uL (ref 1.0–3.6)
LYMPHS PCT: 16 %
MCH: 31.1 pg (ref 26.0–34.0)
MCHC: 33.9 g/dL (ref 32.0–36.0)
MCV: 91.6 fL (ref 80.0–100.0)
Monocytes Absolute: 0.5 10*3/uL (ref 0.2–0.9)
Monocytes Relative: 8 %
NEUTROS ABS: 5.2 10*3/uL (ref 1.4–6.5)
NEUTROS PCT: 75 %
Platelets: 171 10*3/uL (ref 150–440)
RBC: 4.97 MIL/uL (ref 3.80–5.20)
RDW: 17.2 % — ABNORMAL HIGH (ref 11.5–14.5)
WBC: 6.9 10*3/uL (ref 3.6–11.0)

## 2016-06-09 LAB — COMPREHENSIVE METABOLIC PANEL
ALK PHOS: 287 U/L — AB (ref 38–126)
ALT: 57 U/L — AB (ref 14–54)
AST: 138 U/L — ABNORMAL HIGH (ref 15–41)
Albumin: 3.6 g/dL (ref 3.5–5.0)
Anion gap: 15 (ref 5–15)
BUN: 48 mg/dL — ABNORMAL HIGH (ref 6–20)
CO2: 19 mmol/L — AB (ref 22–32)
CREATININE: 2.08 mg/dL — AB (ref 0.44–1.00)
Calcium: 9.9 mg/dL (ref 8.9–10.3)
Chloride: 104 mmol/L (ref 101–111)
GFR calc non Af Amer: 24 mL/min — ABNORMAL LOW (ref >60)
GFR, EST AFRICAN AMERICAN: 28 mL/min — AB (ref >60)
Glucose, Bld: 119 mg/dL — ABNORMAL HIGH (ref 65–99)
Potassium: 3.8 mmol/L (ref 3.5–5.1)
SODIUM: 138 mmol/L (ref 135–145)
Total Bilirubin: 0.9 mg/dL (ref 0.3–1.2)
Total Protein: 8.3 g/dL — ABNORMAL HIGH (ref 6.5–8.1)

## 2016-06-09 LAB — URINALYSIS COMPLETE WITH MICROSCOPIC (ARMC ONLY)
BACTERIA UA: NONE SEEN
Bilirubin Urine: NEGATIVE
Glucose, UA: 50 mg/dL — AB
KETONES UR: NEGATIVE mg/dL
Nitrite: NEGATIVE
PH: 5 (ref 5.0–8.0)
PROTEIN: 100 mg/dL — AB
SPECIFIC GRAVITY, URINE: 1.013 (ref 1.005–1.030)

## 2016-06-09 LAB — TROPONIN I: TROPONIN I: 0.04 ng/mL — AB (ref <0.03)

## 2016-06-09 MED ORDER — SODIUM CHLORIDE 0.9 % IV SOLN
1000.0000 mL | Freq: Once | INTRAVENOUS | Status: AC
  Administered 2016-06-09: 1000 mL via INTRAVENOUS

## 2016-06-09 MED ORDER — ENOXAPARIN SODIUM 60 MG/0.6ML ~~LOC~~ SOLN
55.0000 mg | SUBCUTANEOUS | Status: DC
  Filled 2016-06-09 (×2): qty 0.6

## 2016-06-09 MED ORDER — ACETAMINOPHEN 650 MG RE SUPP: 650.0000 mg | Freq: Four times a day (QID) | RECTAL | Status: DC | PRN

## 2016-06-09 MED ORDER — ACETAMINOPHEN 325 MG PO TABS: 650.0000 mg | ORAL_TABLET | Freq: Four times a day (QID) | ORAL | Status: DC | PRN

## 2016-06-09 MED ORDER — BISACODYL 5 MG PO TBEC
5.0000 mg | DELAYED_RELEASE_TABLET | Freq: Every day | ORAL | Status: DC | PRN
  Administered 2016-06-11: 5 mg via ORAL
  Filled 2016-06-09: qty 1

## 2016-06-09 MED ORDER — HEPARIN SODIUM (PORCINE) 5000 UNIT/ML IJ SOLN: 5000.0000 [IU] | Freq: Three times a day (TID) | INTRAMUSCULAR | Status: DC

## 2016-06-09 MED ORDER — ONDANSETRON HCL 4 MG/2ML IJ SOLN: 4.0000 mg | Freq: Four times a day (QID) | INTRAMUSCULAR | Status: DC | PRN

## 2016-06-09 MED ORDER — TRAMADOL HCL 50 MG PO TABS
50.0000 mg | ORAL_TABLET | Freq: Four times a day (QID) | ORAL | Status: DC | PRN
  Administered 2016-06-09 – 2016-06-11 (×5): 50 mg via ORAL
  Filled 2016-06-09 (×5): qty 1

## 2016-06-09 MED ORDER — DEXTROSE 5 % IV SOLN
1.0000 g | INTRAVENOUS | Status: DC
  Administered 2016-06-09 – 2016-06-10 (×2): 1 g via INTRAVENOUS
  Filled 2016-06-09 (×3): qty 10

## 2016-06-09 MED ORDER — ONDANSETRON HCL 4 MG PO TABS: 4.0000 mg | ORAL_TABLET | Freq: Four times a day (QID) | ORAL | Status: DC | PRN

## 2016-06-09 MED ORDER — SODIUM CHLORIDE 0.9 % IV SOLN
INTRAVENOUS | Status: DC
  Administered 2016-06-09: 18:00:00 via INTRAVENOUS

## 2016-06-09 MED ORDER — TRAZODONE HCL 50 MG PO TABS: 25.0000 mg | ORAL_TABLET | Freq: Every evening | ORAL | Status: DC | PRN

## 2016-06-09 MED ORDER — DOCUSATE SODIUM 100 MG PO CAPS
100.0000 mg | ORAL_CAPSULE | Freq: Two times a day (BID) | ORAL | Status: DC
  Administered 2016-06-09 – 2016-06-11 (×4): 100 mg via ORAL
  Filled 2016-06-09 (×4): qty 1

## 2016-06-09 MED ORDER — DEXTROSE 5 % IV SOLN
500.0000 mg | INTRAVENOUS | Status: DC
  Administered 2016-06-09 – 2016-06-10 (×2): 500 mg via INTRAVENOUS
  Filled 2016-06-09 (×3): qty 500

## 2016-06-09 NOTE — ED Notes (Signed)
Patient will be ordered a regular diet per admitting MD. Patient given sandwich and drink while we wait for bed assignment.

## 2016-06-09 NOTE — ED Triage Notes (Signed)
Pt presents with open wounds to left upper chest area with severe swelling to left arm.

## 2016-06-09 NOTE — Progress Notes (Signed)
ANTICOAGULATION CONSULT NOTE - Initial Consult  Pharmacy Consult for Lovenox  Indication: possible PE  Allergies  Allergen Reactions  . Aspirin Anaphylaxis  . Codeine Anaphylaxis    Patient Measurements: Height: 5\' 6"  (167.6 cm) Weight: 118 lb 14.4 oz (53.9 kg) IBW/kg (Calculated) : 59.3 Heparin Dosing Weight:   Vital Signs: Temp: 98.1 F (36.7 C) (08/07 1743) Temp Source: Oral (08/07 1743) BP: 138/86 (08/07 1743) Pulse Rate: 116 (08/07 1743)  Labs:  Recent Labs  06/21/2016 1223  HGB 15.4  HCT 45.6  PLT 171  CREATININE 2.08*  TROPONINI 0.04*    Estimated Creatinine Clearance: 23.9 mL/min (by C-G formula based on SCr of 2.08 mg/dL).   Medical History: Past Medical History:  Diagnosis Date  . Asthma   . Cancer ([place])   . Hypertension     Medications:  Prescriptions Prior to Admission  Medication Sig Dispense Refill Last Dose  . lisinopril (PRINIVIL,ZESTRIL) 40 MG tablet Take 40 mg by mouth daily.   unknown at unknown  . potassium chloride (K-DUR) 10 MEQ tablet Take 1 tablet (10 mEq total) by mouth daily. 5 tablet 0 unknown at unknown  . traMADol (ULTRAM) 50 MG tablet Take 1 tablet (50 mg total) by mouth every 6 (six) hours as needed. 30 tablet 0 unknown at unknown    Assessment: CrCl = 23.9 ml/min TBW = 53.9 kg   Goal of Therapy:  resolution of PE Monitor platelets by anticoagulation protocol: Yes   Plan:  Lovenox 60 mg SQ Q12H originally ordered.  Will adjust dose to lovenox 55 mg SQ Q24H based on CrCl < 30 ml/min.   Zanae Kuehnle D 06/28/2016,6:33 PM

## 2016-06-09 NOTE — ED Notes (Signed)
Patient transported to X-ray 

## 2016-06-09 NOTE — ED Provider Notes (Signed)
[facility] Emergency Department Provider Note   ____________________________________________    I have reviewed the triage vital signs and the nursing notes.   HISTORY  Chief Complaint Cellulitis   HPI Charlotte Tran is a 60 y.o. female who is brought in by family for weakness. Patient lives alone. Family lives in [place]. Patient has had increasing difficulty caring for herself. She has a history of breast cancer status post mastectomy 9 years ago. She has noted redness and pain to her left anterior chest and shoulder she has had erythema extending away from the area as well. She denies fevers but has felt short of breath and weak all over. She denies chest pain. No nausea or vomiting. She is a history of lymphedema in the left extremity   Past Medical History:  Diagnosis Date  . Asthma   . Cancer ([place])   . Hypertension     There are no active problems to display for this patient.   Past Surgical History:  Procedure Laterality Date  . BREAST SURGERY      Prior to Admission medications   Medication Sig Start Date End Date Taking? Authorizing Provider  doxycycline (VIBRAMYCIN) 100 MG capsule Take 1 capsule (100 mg total) by mouth 2 (two) times daily. 04/18/16   Delman Kitten, MD  lisinopril (PRINIVIL,ZESTRIL) 40 MG tablet Take 40 mg by mouth daily.    Historical Provider, MD  potassium chloride (K-DUR) 10 MEQ tablet Take 1 tablet (10 mEq total) by mouth daily. 04/18/16   Delman Kitten, MD  traMADol (ULTRAM) 50 MG tablet Take 1 tablet (50 mg total) by mouth every 6 (six) hours as needed. 04/18/16   Delman Kitten, MD     Allergies Aspirin and Codeine  No family history on file.  Social History Social History  Substance Use Topics  . Smoking status: Never Smoker  . Smokeless tobacco: Never Used  . Alcohol use Yes    Review of Systems  Constitutional: No fever/chills Eyes: No visual changes.  ENT: No sore throat. Cardiovascular: Denies chest  pain. Respiratory: Mild shortness of breath Gastrointestinal: No abdominal pain.  No nausea, no vomiting.   Genitourinary: Negative for dysuria. Musculoskeletal: Negative for back pain. Skin: Positive for rash Neurological: Diffuse weakness  10-point ROS otherwise negative.  ____________________________________________   PHYSICAL EXAM:  VITAL SIGNS: ED Triage Vitals  Enc Vitals Group     BP 06/27/2016 1137 125/85     Pulse Rate 06/04/2016 1137 (!) 122     Resp 07/01/2016 1137 19     Temp 06/11/2016 1137 97.6 F (36.4 C)     Temp Source 06/03/2016 1137 Oral     SpO2 06/17/2016 1137 93 %     Weight 06/16/2016 1137 133 lb (60.3 kg)     Height 06/06/2016 1137 5\' 6"  (1.676 m)     Head Circumference --      Peak Flow --      Pain Score 06/05/2016 1152 8     Pain Loc --      Pain Edu? --      Excl. in [place]? --     Constitutional: Alert and oriented. Chronically ill-appearing Eyes: Conjunctivae are normal.  Head: Atraumatic. Nose: No congestion/rhinnorhea. Mouth/Throat: Mucous membranes are moist.   Neck:  Painless ROM Cardiovascular: Tachycardia, regular rhythm. Grossly normal heart sounds.  Good peripheral circulation. Respiratory: Increased respiratory effort with tachypnea.  No retractions. Gastrointestinal: Soft and nontender. No distention.  No CVA tenderness. Genitourinary: deferred Musculoskeletal:  Left arm lymphedema, 2+ distal pulses. Neurologic:  Normal speech and language. No gross focal neurologic deficits are appreciated.  Skin:  Skin is warm, dry. Patient with large area of nodular erythematous lesion to the left superior anterior chest extending onto the back with central ulceration Psychiatric: Mood and affect are normal. Speech and behavior are normal.  ____________________________________________   LABS (all labs ordered are listed, but only abnormal results are displayed)  Labs Reviewed  CBC WITH DIFFERENTIAL/PLATELET - Abnormal; Notable for the following:        Result Value   RDW 17.2 (*)    All other components within normal limits  COMPREHENSIVE METABOLIC PANEL - Abnormal; Notable for the following:    CO2 19 (*)    Glucose, Bld 119 (*)    BUN 48 (*)    Creatinine, Ser 2.08 (*)    Total Protein 8.3 (*)    AST 138 (*)    ALT 57 (*)    Alkaline Phosphatase 287 (*)    GFR calc non Af Amer 24 (*)    GFR calc Af Amer 28 (*)    All other components within normal limits  URINALYSIS COMPLETEWITH MICROSCOPIC ([facility] ONLY)  TROPONIN I   ____________________________________________  EKG  ED ECG REPORT I, Lavonia Drafts, the attending physician, personally viewed and interpreted this ECG.  Date: 06/07/2016 EKG Time: 2:09 PM Rate: 106 Rhythm: Sinus tachycardia QRS Axis: normal Intervals: normal ST/T Wave abnormalities: normal Conduction Disturbances: none   ____________________________________________  RADIOLOGY  Chest x-ray abnormal please see report ____________________________________________   PROCEDURES  Procedure(s) performed: No    Critical Care performed:No ____________________________________________   INITIAL IMPRESSION / ASSESSMENT AND PLAN / ED COURSE  Pertinent labs & imaging results that were available during my care of the patient were reviewed by me and considered in my medical decision making (see chart for details).  Patient presents with diffuse weakness, family reports inability to care for self. She has concerning lesion to the left anterior chest especially given her history of breast cancer. Her creatinine is elevated Consistent with acute renal failure, she is tachycardic and hypoxic and I'm suspicious of PE but unable to obtain CT angiography at this time given her GFR, we will hydrate and admit to the hospital for further workup  Clinical Course  Mildly elevated troponin, EKG unremarkable. Chest x-ray concerning especially given skin lesion for metastatic  disease ____________________________________________   FINAL CLINICAL IMPRESSION(S) / ED DIAGNOSES  Final diagnoses:  Acute renal failure, unspecified acute renal failure type (HCC)  Skin lesion of chest wall  Lymphedema  Shortness of breath      NEW MEDICATIONS STARTED DURING THIS VISIT:  New Prescriptions   No medications on file     Note:  This document was prepared using Dragon voice recognition software and may include unintentional dictation errors.    Lavonia Drafts, MD 06/10/2016 [phone]

## 2016-06-09 NOTE — H&P (Signed)
[facility] [place] NAME: Charlotte Tran    MR#:  [id number]  DATE OF BIRTH:  1956-06-04  DATE OF ADMISSION:  06/27/2016  PRIMARY CARE PHYSICIAN: [facility]   REQUESTING/REFERRING PHYSICIAN: Dr. Corky Tran  CHIEF COMPLAINT:  Left Arm edema    Chief Complaint  Patient presents with  . Cellulitis    HISTORY OF PRESENT ILLNESS:  Charlotte Tran  is a 60 y.o. female with a known history of Hypertension, stage IV breast cancer history of chemotherapy, radiation, left mastectomy 9 years ago comes in with worsening edema of the left hand associated with shortness of breath. Patient was advised to follow-up with oncologist for further treatment options but she refused. Any treatment. Patient has stage IV breast cancer and had a treatment in [place]. Moved from [place] 7 years ago. Patient does not want any further treatments for the breast cancer. And she refused oncology consult also. We are admitting her for acute renal failure. 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 Patient complains of some cough, chronic pain. Denies nausea, vomiting, diarrhea.  PAST MEDICAL HISTORY:   Past Medical History:  Diagnosis Date  . Asthma   . Cancer ([place])   . Hypertension     PAST SURGICAL HISTOIRY:   Past Surgical History:  Procedure Laterality Date  . BREAST SURGERY      SOCIAL HISTORY:   Social History  Substance Use Topics  . Smoking status: Never Smoker  . Smokeless tobacco: Never Used  . Alcohol use Yes    FAMILY HISTORY:  No family history on file.  DRUG ALLERGIES:   Allergies  Allergen Reactions  . Aspirin Anaphylaxis  . Codeine Anaphylaxis    REVIEW OF SYSTEMS:  CONSTITUTIONAL: No fever, fatigue or weakness.  EYES: No blurred or double vision.  EARS, NOSE, AND THROAT: No tinnitus or ear  pain.  RESPIRATORY:Complains of cough, shortness of breath.no  wheezing or hemoptysis.  CARDIOVASCULAR: No chest pain, orthopnea, edema.  GASTROINTESTINAL: No nausea, vomiting, diarrhea or abdominal pain.  GENITOURINARY: No dysuria, hematuria.  ENDOCRINE: No polyuria, nocturia,  HEMATOLOGY: No anemia, easy bruising or bleeding SKIN: No rash or lesion. MUSCULOSKELETAL;left arm edema NEUROLOGIC: No tingling, numbness, weakness.  PSYCHIATRY: No anxiety or depression.   MEDICATIONS AT HOME:   Prior to Admission medications   Medication Sig Start Date End Date Taking? Authorizing Provider  lisinopril (PRINIVIL,ZESTRIL) 40 MG tablet Take 40 mg by mouth daily.   Yes Historical Provider, MD  potassium chloride (K-DUR) 10 MEQ tablet Take 1 tablet (10 mEq total) by mouth daily. 04/18/16  Yes Charlotte Kitten, MD  traMADol (ULTRAM) 50 MG tablet Take 1 tablet (50 mg total) by mouth every 6 (six) hours as needed. 04/18/16  Yes Charlotte Kitten, MD      VITAL SIGNS:  Blood pressure 109/83, pulse (!) 103, temperature 97.6 F (36.4 C), temperature source Oral, resp. rate (!) 26, height 5\' 6"  (1.676 m), weight 60.3 kg (133 lb), SpO2 96 %.  PHYSICAL EXAMINATION:  GENERAL:  60 y.o.-year-old patient lying in the bed with no acute distress.  EYES: Pupils equal, round, reactive to light and accommodation. No scleral icterus. Extraocular muscles intact.  HEENT: Head atraumatic, normocephalic. Oropharynx and nasopharynx clear.  NECK:  Supple, no jugular venous distention. No thyroid enlargement, no tenderness.  LUNGS: Normal breath sounds bilaterally, no wheezing,  rales,rhonchi or crepitation. No use of accessory muscles of respiration.  CARDIOVASCULAR: S1, S2 normal. No murmurs, rubs, or gallops.  ABDOMEN: Soft, nontender, nondistended. Bowel sounds present. No organomegaly or mass.  EXTREMITIES: Left arm edema present. No edema or cyanosis in the legs. NEUROLOGIC: Cranial nerves II through XII are intact. Muscle  strength 5/5 in all extremities. Sensation intact. Gait not checked.  PSYCHIATRIC: The patient is alert and oriented x 3.  SKIN:. And has large area of erythematous rash in the left anterior chest extending into the back with central ulcers. Patient says that she has chronic rash since the radiation therapy 9 years ago.  LABORATORY PANEL:   CBC  Recent Labs Lab 06/14/2016 1223  WBC 6.9  HGB 15.4  HCT 45.6  PLT 171   ------------------------------------------------------------------------------------------------------------------  Chemistries   Recent Labs Lab 06/15/2016 1223  NA 138  K 3.8  CL 104  CO2 19*  GLUCOSE 119*  BUN 48*  CREATININE 2.08*  CALCIUM 9.9  AST 138*  ALT 57*  ALKPHOS 287*  BILITOT 0.9   ------------------------------------------------------------------------------------------------------------------  Cardiac Enzymes  Recent Labs Lab 06/26/2016 1223  TROPONINI 0.04*   ------------------------------------------------------------------------------------------------------------------  RADIOLOGY:  Dg Chest 2 View  Result Date: 06/23/2016 CLINICAL DATA:  Open wound some left upper chest area was severe swelling the left all arm. EXAM: CHEST  2 VIEW COMPARISON:  04/18/2016. FINDINGS: Interval increase in left base collapse/ consolidation with small left pleural effusion a evident. Right lung remains clear. There is hyperexpansion with underlying chronic interstitial changes suggesting emphysema. Right Port-A-Cath tip overlies the upper right atrium. Bones are diffusely demineralized. As seen previously, posterior right sixth rib is missing with associated masslike overlying soft tissue attenuation. IMPRESSION: 1. Left base collapse/consolidation with small left pleural effusion, new in the interval. 2. Apparent destruction with soft tissue opacity involving the posterior right sixth rib. Neoplastic process is a concern. Electronically Signed   By: Charlotte Tran  M.D.   On: 06/24/2016 13:22    EKG:   Orders placed or performed during the hospital encounter of 04/18/16  . ED EKG  . ED EKG  . EKG 12-Lead  . EKG 12-Lead   Sinus tachycardia 10 6 bpm IMPRESSION AND PLAN:  1.Acute  renal failure  With ATN prerenal with dehydration: Continue IV fluids, check kidney function tomorrow, avoid nephrotoxic agents, hold ACE inhibitor  As well. 2 shortness of breath, tachycardia, hypoxia unable to exclude  PE because of history of metastatic breast cancer. CT angio  chest is not done because of renal failure,; start full dose anticoagulation. 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 but now she is refusing oncology consult and further treatment options.asked  to her many times but she does not want to see any oncologist in the hospital. Consider palliative care consult tomorrow. Chaplain consult regarding CODE STATUS.  She told me that she wishes to be DO NOT RESUSCITATE. 4. history of fibromyalgia. 5 chronic erythematous rash on the left anterior chest secondary to radiation-induced skin changes. #6 left base collapse/consolidation: Start empiric antibiotics.  All the records are reviewed and case discussed with ED provider. Management plans discussed with the patient, family and they are in agreement.  CODE STATUS: Full code for now  TOTAL TIME TAKING CARE OF THIS PATIENT: 55 minutes.    Epifanio Lesches M.D on 06/20/2016 at 3:47 PM  Between 7am to 6pm - Pager - [phone]  After 6pm go to  [email or web address] - password [email or web address] [facility]  Office  [phone]  CC: Primary care physician; [facility]  Note: This dictation was prepared with Dragon dictation along with smaller phrase technology. Any transcriptional errors that result from this process are unintentional.

## 2016-06-09 NOTE — ED Notes (Signed)
Patient Sat 88-89% on RA. Patient placed on 2L

## 2016-06-09 NOTE — Progress Notes (Signed)
Palliative:  There will unfortunately not be palliative medicine coverage 06/10/2016 and we will plan to see Ms. Deland  Wednesday 06/11/2016. Thank you for this consult and we apologize for the delay in consultation.   Vinie Sill, NP Palliative Medicine Team Pager # [phone] (M-F 8a-5p) Team Phone # [phone] (Nights/Weekends)

## 2016-06-10 LAB — CBC
HEMATOCRIT: 39.5 % (ref 35.0–47.0)
HEMOGLOBIN: 13.3 g/dL (ref 12.0–16.0)
MCH: 31.1 pg (ref 26.0–34.0)
MCHC: 33.8 g/dL (ref 32.0–36.0)
MCV: 92.2 fL (ref 80.0–100.0)
Platelets: 120 10*3/uL — ABNORMAL LOW (ref 150–440)
RBC: 4.28 MIL/uL (ref 3.80–5.20)
RDW: 17.2 % — AB (ref 11.5–14.5)
WBC: 5.8 10*3/uL (ref 3.6–11.0)

## 2016-06-10 LAB — BASIC METABOLIC PANEL
ANION GAP: 12 (ref 5–15)
BUN: 41 mg/dL — AB (ref 6–20)
CHLORIDE: 111 mmol/L (ref 101–111)
CO2: 19 mmol/L — AB (ref 22–32)
Calcium: 9.2 mg/dL (ref 8.9–10.3)
Creatinine, Ser: 1.37 mg/dL — ABNORMAL HIGH (ref 0.44–1.00)
GFR calc Af Amer: 47 mL/min — ABNORMAL LOW (ref >60)
GFR, EST NON AFRICAN AMERICAN: 40 mL/min — AB (ref >60)
GLUCOSE: 83 mg/dL (ref 65–99)
POTASSIUM: 4.1 mmol/L (ref 3.5–5.1)
Sodium: 142 mmol/L (ref 135–145)

## 2016-06-10 LAB — GLUCOSE, CAPILLARY: Glucose-Capillary: 88 mg/dL (ref 65–99)

## 2016-06-10 MED ORDER — ENOXAPARIN SODIUM 60 MG/0.6ML ~~LOC~~ SOLN
55.0000 mg | Freq: Two times a day (BID) | SUBCUTANEOUS | Status: DC
  Filled 2016-06-10 (×4): qty 0.6

## 2016-06-10 NOTE — Care Management (Signed)
Admitted to [facility] with the diagnosis of acute renal failure. Lives in [place] at [address] x 9 years. Mother is Audelia Acton, she lives in [place], but is visiting now. ( No cell phone) Marcy Siren is [employer] [phone]). Brother is Leticia Penna [phone]). Last seen Dr. Gwynneth Aliment at [facility] in June. No home health. No skilled facility. No home oxygen. Uses a cane to aid in ambulation. Would like a rolling walker. [address] in June and May. Fair appetite (picks at food.) Takes care of all basic activities of daily living herself. Girlfriend helps with errands.  Diagnosed with breast cancer 9 years ago and was treated at that time. States "they found a nodule this time and I don't want any treatment." Would like to go to the [facility] when discharged. Discussed that the hospice home is for end of life care, less than 2 weeks. Doesn't want to go back to the [facility]. Feels like she will not be cared for the way she may need to be cared for. Discussed Hospice agencies. Would like [facility], if needed. Explained that a request for physical therapy would be discussed with Dr. Vianne Bulls. Further discharge plans could be discussed following physical therapy recommendations.

## 2016-06-10 NOTE — Evaluation (Signed)
Physical Therapy Evaluation Patient Details Name: Charlotte Tran MRN: [id number] DOB: 29-Jan-1956 Today's Date: 06/10/2016   History of Present Illness  Pt is a 60 y/o female admitted with a diagnosis of acute renal failure. Pt brought in by family due to SOB and weakness; she has open wounds to L upper chest and significant swelling of L arm. PMH includes HTN, stage IV breat cancer (chemotherapy, radiation, L mastectomy 9 years ago).   Clinical Impression  Pt is a pleasant 60 y/o female who present with generalized weakness and difficulty walking. Spoke to RN prior to evaluation regarding possibility of PE as per MD notes, RN states pt receiving lovenox. PLOF: pt reports she used to be independent with ADLs and ambulation with SPC, but has recently had more trouble taking care of herself due to pain, weakness, and fatigue. Pt requires min guard/min assist for bed mobility and mod assist for transfers and ambulation. Pt limited due to fatigue and pain throughout session along with O2 sats. Pt O2 desats with ambulation and transfer to [employer] to 80%, pt instructed on pursed lip breathing, O2 back to low 90s within 3 minutes resting. SOB noted, pt reports she "feels like I'm always trying to catch my breath," even prior to hospital admission. No dizziness or lightheadedness noted. Pt moves slowly with all mobility due to R LE pain and SOB, transferred back to bed and O2 desat to 83%, 5 minutes resting to raise O2 back to low 90s.RN notified of pt's quick O2 desat. Pt will benefit from skilled PT services in order to improve strength, balance, functional mobility, endurance, and safety with DME use. At this time pt is appropriate for STR pending medical clearance, palliative care consult, and ability to participate.     Follow Up Recommendations SNF    Equipment Recommendations  Rolling walker with 5" wheels    Recommendations for Other Services       Precautions / Restrictions Precautions Precautions:  Fall Restrictions Weight Bearing Restrictions: No      Mobility  Bed Mobility Overal bed mobility: Needs Assistance Bed Mobility: Supine to Sit     Supine to sit: Min guard     General bed mobility comments: Pt able to perform bed mobility with min guard. Moves very slowly due to pain in R LE, SOB symptoms noted.  Transfers Overall transfer level: Needs assistance Equipment used: Rolling walker (2 wheeled) Transfers: Sit to/from Stand Sit to Stand: Mod assist         General transfer comment: Pt requires verbal cueing for safe UE placement with sit/stand transfers. Pt moves very slowly due to R LE pain.  Ambulation/Gait Ambulation/Gait assistance: Mod assist Ambulation Distance (Feet): 2 Feet Assistive device: Rolling walker (2 wheeled) Gait Pattern/deviations: Step-to pattern;Decreased step length - right;Decreased step length - left;Decreased stride length;Antalgic;Trunk flexed   Gait velocity interpretation: Below normal speed for age/gender General Gait Details: Pt demonstrates a very slow, step-to gait pattern. Pt gets fatigued very quickly, Desat to 80% with ambulation to BSC on 2L O2, back to low 90s within 3 minutes.   Stairs            Wheelchair Mobility    Modified Rankin (Stroke Patients Only)       Balance Overall balance assessment: Needs assistance Sitting-balance support: Bilateral upper extremity supported;Feet supported Sitting balance-Leahy Scale: Good     Standing balance support: Bilateral upper extremity supported Standing balance-Leahy Scale: Fair Standing balance comment: Pt requires mod assist to maintain standing balance  with B UE support on RW.                             Pertinent Vitals/Pain Pain Assessment: Faces Faces Pain Scale: Hurts even more Pain Location: R LE Pain Descriptors / Indicators: Sharp Pain Intervention(s): Limited activity within patient's tolerance;Monitored during session    [place] expects to be discharged to:: Private residence (Day) Living Arrangements: Alone Available Help at Discharge:  (Pt's family lives in [place]) Type of Home: [place] Access: Stairs to enter Entrance Stairs-Rails: Can reach both Entrance Stairs-Number of Steps: 2 Home Layout: One level Home Equipment: Cane - single point      Prior Function Level of Independence: Independent with assistive device(s)         Comments: Pt reports she used to be independent with ADLs and able to ambulate with [facility] but recently has had more trouble with ambulation. Only able to walk from room to room very slowly, has not been stable with the [facility].     Hand Dominance        Extremity/Trunk Assessment   Upper Extremity Assessment: Generalized weakness (UE grossly 4-/5)           Lower Extremity Assessment: Generalized weakness (LE grossly 3+/5, limited by pain)         Communication   Communication: No difficulties  Cognition Arousal/Alertness: Awake/alert Behavior During Therapy: WFL for tasks assessed/performed Overall Cognitive Status: Within Functional Limits for tasks assessed                      General Comments General comments (skin integrity, edema, etc.): Open wounds to L upper chest and significant swelling of L arm.    Exercises Other Exercises Other Exercises: Supine ther ex: B SLR x 8 reps with verbal cueing for proper technique. Desat to 85% after SLRs, no other ther-ex attempted.  Other Exercises: Pt taken to [facility], mod assist for sit/stand transfers with verbal cueing for safe technique.       Assessment/Plan    PT Assessment Patient needs continued PT services  PT Diagnosis Difficulty walking;Generalized weakness;Acute pain   PT Problem List Decreased strength;Decreased activity tolerance;Decreased balance;Decreased mobility;Decreased coordination;Decreased knowledge of use of DME;Decreased safety awareness;Pain  PT Treatment  Interventions DME instruction;Gait training;Stair training;Functional mobility training;Therapeutic activities;Therapeutic exercise;Balance training;Neuromuscular re-education;Patient/family education   PT Goals (Current goals can be found in the Care Plan section) Acute Rehab PT Goals Patient Stated Goal: To go to a nursing home or hospice home PT Goal Formulation: With patient Time For Goal Achievement: 06/24/16 Potential to Achieve Goals: Good    Frequency Min 2X/week   Barriers to discharge Decreased caregiver support Pt's family lives in [place], has no assistance in [place]    Co-evaluation               End of Session Equipment Utilized During Treatment: Gait belt;Oxygen Activity Tolerance: Patient limited by fatigue;Patient limited by pain Patient left: in bed;with call bell/phone within reach;with bed alarm set Nurse Communication: Mobility status (RN notified of pt desat within session.)         Time: [id number] PT Time Calculation (min) (ACUTE ONLY): 31 min   Charges:   PT Evaluation $PT Eval Moderate Complexity: 1 Procedure PT Treatments $Therapeutic Activity: 8-22 mins   PT G Codes:        Georgina Pillion 07-08-2016, 5:38 PM  Georgina Pillion, SPT [phone]

## 2016-06-10 NOTE — Progress Notes (Signed)
[place] at [place] NAME: Charlotte Tran    MR#:  [id number]  DATE OF BIRTH:  21-Aug-1956  SUBJECTIVE: admitted  yesterday for acute renal failure . has history of metastatic breast cancer. She has expressed the wish to be transferred to hospice home today if possible. I discussed this with the social worker Palliative care services are not available today. She denies any other complaints   CHIEF COMPLAINT:   Chief Complaint  Patient presents with  . Cellulitis    REVIEW OF SYSTEMS:    Review of Systems  Constitutional: Negative for chills, fever and weight loss.  HENT: Negative for ear pain and hearing loss.   Eyes: Negative for blurred vision, double vision and photophobia.  Respiratory: Negative for cough, hemoptysis, sputum production, shortness of breath and wheezing.   Cardiovascular: Negative for chest pain, palpitations, orthopnea, claudication, leg swelling and PND.  Gastrointestinal: Negative for abdominal pain, diarrhea, heartburn, nausea and vomiting.  Genitourinary: Negative for dysuria, frequency, hematuria and urgency.  Musculoskeletal: Negative for back pain, falls, joint pain, myalgias and neck pain.       Left arm edema present  Neurological: Negative for dizziness, tingling, tremors, sensory change, speech change and headaches.  Endo/Heme/Allergies: Negative for environmental allergies. Does not bruise/bleed easily.  Psychiatric/Behavioral: Negative for depression, hallucinations, substance abuse and suicidal ideas. The patient is not nervous/anxious and does not have insomnia.     Nutrition: Tolerating Diet: Tolerating PT:      DRUG ALLERGIES:   Allergies  Allergen Reactions  . Aspirin Anaphylaxis  . Codeine Anaphylaxis    VITALS:  Blood pressure 130/67, pulse 96, temperature 98.8 F (37.1 C), resp. rate 18, height 5\' 6"  (1.676 m), weight 53.9 kg (118 lb 14.4 oz), SpO2 94 %.  PHYSICAL EXAMINATION:    Physical Exam  GENERAL:  60 y.o.-year-old patient lying in the bed with no acute distress.  EYES: Pupils equal, round, reactive to light and accommodation. No scleral icterus. Extraocular muscles intact.  HEENT: Head atraumatic, normocephalic. Oropharynx and nasopharynx clear.  NECK:  Supple, no jugular venous distention. No thyroid enlargement, no tenderness.  LUNGS: Normal breath sounds bilaterally, no wheezing, rales,rhonchi or crepitation. No use of accessory muscles of respiration.  CARDIOVASCULAR: S1, S2 normal. No murmurs, rubs, or gallops.  ABDOMEN: Soft, nontender, nondistended. Bowel sounds present. No organomegaly or mass.  EXTREMITIEe;Left  edema present NEUROLOGIC: Cranial nerves II through XII are intact. Muscle strength 5/5 in all extremities. Sensation intact. Gait not checked.  PSYCHIATRIC: The patient is alert and oriented x 3.  SKIN: No obvious rash, lesion, or ulcer.    LABORATORY PANEL:   CBC  Recent Labs Lab 06/10/16 0427  WBC 5.8  HGB 13.3  HCT 39.5  PLT 120*   ------------------------------------------------------------------------------------------------------------------  Chemistries   Recent Labs Lab 06/08/2016 1223 06/10/16 0427  NA 138 142  K 3.8 4.1  CL 104 111  CO2 19* 19*  GLUCOSE 119* 83  BUN 48* 41*  CREATININE 2.08* 1.37*  CALCIUM 9.9 9.2  AST 138*  --   ALT 57*  --   ALKPHOS 287*  --   BILITOT 0.9  --    ------------------------------------------------------------------------------------------------------------------  Cardiac Enzymes  Recent Labs Lab 06/08/2016 1223  TROPONINI 0.04*   ------------------------------------------------------------------------------------------------------------------  RADIOLOGY:  Dg Chest 2 View  Result Date: 06/03/2016 CLINICAL DATA:  Open wound some left upper chest area was severe swelling the left all arm. EXAM: CHEST  2 VIEW COMPARISON:  04/18/2016. FINDINGS: Interval increase in left  base collapse/ consolidation with small left pleural effusion a evident. Right lung remains clear. There is hyperexpansion with underlying chronic interstitial changes suggesting emphysema. Right Port-A-Cath tip overlies the upper right atrium. Bones are diffusely demineralized. As seen previously, posterior right sixth rib is missing with associated masslike overlying soft tissue attenuation. IMPRESSION: 1. Left base collapse/consolidation with small left pleural effusion, new in the interval. 2. Apparent destruction with soft tissue opacity involving the posterior right sixth rib. Neoplastic process is a concern. Electronically Signed   By: Misty Stanley M.D.   On: 06/11/2016 13:22     ASSESSMENT AND PLAN:   Active Problems:   Acute renal failure (ARF) (HCC)   Pressure ulcer   #1 acute renal failure due to ATN: Improved with IV hydration. #2 shortness of breath, tachycardia unable to exclude the PE, CT angio chest not done due to renal failure on admission;on full dose lovenox. She is at high risk for clots due to history of breast cancer #3 history of metastatic breast cancer: Patient refused the oncology follow up. Now she is interested in hospice home placement. CODE STATUS changed to DO NOT RESUSCITATE as per her wishes.  For UTI, pneumonia: On Rocephin, Zithromax. Follow urine cultures.  All the records are reviewed and case discussed with Care Management/Social Workerr. Management plans discussed with the patient, family and they are in agreement.  CODE STATUS: DNR  TOTAL TIME TAKING CARE OF THIS PATIENT: 35 minutes.   POSSIBLE D/C IN 1-2 daAYS, DEPENDING ON CLINICAL CONDITION.   Epifanio Lesches M.D on 06/10/2016 at 8:49 AM  Between 7am to 6pm - Pager - [phone]  After 6pm go to [email or web address] - password [email or web address] [place] [facility]  Office  [phone]  CC: Primary care physician; [facility]

## 2016-06-10 NOTE — Progress Notes (Signed)
Pharmacy Note - Anticoagulation  Patient with orders for enoxaparin 55mg  SQ Q24H for treatment of suspected PE  Original orders adjusted for CrCl < 48ml/min. Renal function improved.  Estimated Creatinine Clearance: 36.2 mL/min (by C-G formula based on SCr of 1.37 mg/dL).  Will change to enoxaparin 30mg  SQ Q12H as indicated for CrCl > 30 ml/min  Rexene Edison, PharmD Clinical Pharmacist 06/10/2016 1:50 PM

## 2016-06-10 NOTE — Progress Notes (Signed)
   06/10/16 0800  Clinical Encounter Type  Visited With Patient  Visit Type Initial;Other (Comment) (Initial AD education)  Referral From Nurse  Consult/Referral To Chaplain  Spiritual Encounters  Spiritual Needs Literature;Emotional  Stress Factors  Patient Stress Factors Exhausted;Family relationships;Health changes;Major life changes  Advance Directives (For Healthcare)  Does patient have an advance directive? No  Would patient like information on creating an advanced directive? Yes - Scientist, clinical (histocompatibility and immunogenetics) given  Visited patient to assess spiritual well-being. Expressed desire for DNR. Advised her to notify nurse and she will advise her physician to complete. Also, explained Healthcare POA & Living Will. She will discuss those with her mother who visits today. Advised her to notify nurse to page the Chaplain when Armeda is ready to proceed. Crystianna is alert and aware of her declining health. She advised discussing hospice care with staff yesterday. She will page when her mother arrives so that we can continue spiritual care.  Chap. Peyton Spengler G. [place]

## 2016-06-11 DIAGNOSIS — R0602 Shortness of breath: Secondary | ICD-10-CM

## 2016-06-11 DIAGNOSIS — C50919 Malignant neoplasm of unspecified site of unspecified female breast: Secondary | ICD-10-CM

## 2016-06-11 DIAGNOSIS — Z66 Do not resuscitate: Secondary | ICD-10-CM

## 2016-06-11 DIAGNOSIS — C799 Secondary malignant neoplasm of unspecified site: Secondary | ICD-10-CM

## 2016-06-11 DIAGNOSIS — Z515 Encounter for palliative care: Secondary | ICD-10-CM

## 2016-06-11 DIAGNOSIS — I890 Lymphedema, not elsewhere classified: Secondary | ICD-10-CM

## 2016-06-11 DIAGNOSIS — N179 Acute kidney failure, unspecified: Secondary | ICD-10-CM

## 2016-06-11 LAB — URINE CULTURE

## 2016-06-11 LAB — GLUCOSE, CAPILLARY
Glucose-Capillary: 67 mg/dL (ref 65–99)
Glucose-Capillary: 82 mg/dL (ref 65–99)

## 2016-06-11 MED ORDER — LISINOPRIL 20 MG PO TABS
40.0000 mg | ORAL_TABLET | Freq: Every day | ORAL | Status: DC
  Administered 2016-06-11: 15:00:00 40 mg via ORAL
  Filled 2016-06-11 (×2): qty 2

## 2016-06-11 MED ORDER — MORPHINE SULFATE (CONCENTRATE) 10 MG/0.5ML PO SOLN
5.0000 mg | ORAL | Status: DC | PRN
  Administered 2016-06-11 – 2016-06-12 (×2): 5 mg via ORAL
  Filled 2016-06-11 (×2): qty 1

## 2016-06-11 MED ORDER — LORAZEPAM 1 MG PO TABS: 1.0000 mg | ORAL_TABLET | ORAL | Status: DC | PRN

## 2016-06-11 MED ORDER — POLYETHYLENE GLYCOL 3350 17 G PO PACK
17.0000 g | PACK | Freq: Every day | ORAL | Status: DC
  Filled 2016-06-11: qty 1

## 2016-06-11 MED ORDER — AZITHROMYCIN 250 MG PO TABS: 500.0000 mg | ORAL_TABLET | ORAL | Status: DC

## 2016-06-11 NOTE — Progress Notes (Signed)
[place] at [place] NAME: Charlotte Tran    MR#:  [id number]  DATE OF BIRTH:  January 12, 1956  SUBJECTIVE; the patient denies any complaints. Physical therapy recommends a nursing facility placement;   CHIEF COMPLAINT:   Chief Complaint  Patient presents with  . Cellulitis    REVIEW OF SYSTEMS:    Review of Systems  Constitutional: Negative for chills, fever and weight loss.  HENT: Negative for ear pain and hearing loss.   Eyes: Negative for blurred vision, double vision and photophobia.  Respiratory: Negative for cough, hemoptysis, sputum production, shortness of breath and wheezing.   Cardiovascular: Negative for chest pain, palpitations, orthopnea, claudication, leg swelling and PND.  Gastrointestinal: Negative for abdominal pain, diarrhea, heartburn, nausea and vomiting.  Genitourinary: Negative for dysuria, frequency, hematuria and urgency.  Musculoskeletal: Negative for back pain, falls, joint pain, myalgias and neck pain.       Left arm edema present  Neurological: Negative for dizziness, tingling, tremors, sensory change, speech change and headaches.  Endo/Heme/Allergies: Negative for environmental allergies. Does not bruise/bleed easily.  Psychiatric/Behavioral: Negative for depression, hallucinations, substance abuse and suicidal ideas. The patient is not nervous/anxious and does not have insomnia.     Nutrition: Tolerating Diet: Tolerating PT:      DRUG ALLERGIES:   Allergies  Allergen Reactions  . Aspirin Anaphylaxis  . Codeine Anaphylaxis    VITALS:  Blood pressure 129/80, pulse 100, temperature 98.1 F (36.7 C), resp. rate 16, height 5\' 6"  (1.676 m), weight 54.5 kg (120 lb 1.6 oz), SpO2 92 %.  PHYSICAL EXAMINATION:   Physical Exam  GENERAL:  60 y.o.-year-old patient lying in the bed with no acute distress.  EYES: Pupils equal, round, reactive to light and accommodation. No scleral icterus. Extraocular  muscles intact.  HEENT: Head atraumatic, normocephalic. Oropharynx and nasopharynx clear.  NECK:  Supple, no jugular venous distention. No thyroid enlargement, no tenderness.  LUNGS: Normal breath sounds bilaterally, no wheezing, rales,rhonchi or crepitation. No use of accessory muscles of respiration.  CARDIOVASCULAR: S1, S2 normal. No murmurs, rubs, or gallops.  ABDOMEN: Soft, nontender, nondistended. Bowel sounds present. No organomegaly or mass.  EXTREMITIEe;Left  edema present NEUROLOGIC: Cranial nerves II through XII are intact. Muscle strength 5/5 in all extremities. Sensation intact. Gait not checked.  PSYCHIATRIC: The patient is alert and oriented x 3.  SKIN: No obvious rash, lesion, or ulcer.    LABORATORY PANEL:   CBC  Recent Labs Lab 06/10/16 0427  WBC 5.8  HGB 13.3  HCT 39.5  PLT 120*   ------------------------------------------------------------------------------------------------------------------  Chemistries   Recent Labs Lab 06/11/2016 1223 06/10/16 0427  NA 138 142  K 3.8 4.1  CL 104 111  CO2 19* 19*  GLUCOSE 119* 83  BUN 48* 41*  CREATININE 2.08* 1.37*  CALCIUM 9.9 9.2  AST 138*  --   ALT 57*  --   ALKPHOS 287*  --   BILITOT 0.9  --    ------------------------------------------------------------------------------------------------------------------  Cardiac Enzymes  Recent Labs Lab 06/22/2016 1223  TROPONINI 0.04*   ------------------------------------------------------------------------------------------------------------------  RADIOLOGY:  Dg Chest 2 View  Result Date: 06/11/2016 CLINICAL DATA:  Open wound some left upper chest area was severe swelling the left all arm. EXAM: CHEST  2 VIEW COMPARISON:  04/18/2016. FINDINGS: Interval increase in left base collapse/ consolidation with small left pleural effusion a evident. Right lung remains clear. There is hyperexpansion with underlying chronic interstitial changes suggesting emphysema. Right  Port-A-Cath tip overlies  the upper right atrium. Bones are diffusely demineralized. As seen previously, posterior right sixth rib is missing with associated masslike overlying soft tissue attenuation. IMPRESSION: 1. Left base collapse/consolidation with small left pleural effusion, new in the interval. 2. Apparent destruction with soft tissue opacity involving the posterior right sixth rib. Neoplastic process is a concern. Electronically Signed   By: Misty Stanley M.D.   On: 06/19/2016 13:22     ASSESSMENT AND PLAN:   Active Problems:   Acute renal failure (ARF) (HCC)   Pressure ulcer   #1 acute renal failure due to ATN: Improved with IV hydration. #2 shortness of breath, tachycardia unable to exclude the PE, CT angio chest not done due to renal failure on admission;on full dose lovenox. She is at high risk for clots due to history of breast cancer  hypoxia desatted to 80% with ambulation,90 percent at rest. To discharge her to skilled nursing facility when arrangements are made with oxygen. medically ready today. For discharge  #3 history of metastatic breast cancer: Patient refused the oncology follow up. Now she is interested in hospice home placement. CODE STATUS changed to DO NOT RESUSCITATE as per her wishes.  For UTI, pneumonia: On Rocephin, Zithromax. Follow urine cultures.  All the records are reviewed and case discussed with Care Management/Social Workerr. Management plans discussed with the patient, family and they are in agreement.  CODE STATUS: DNR  TOTAL TIME TAKING CARE OF THIS PATIENT: 35 minutes.   POSSIBLE D/C IN 1-2 daAYS, DEPENDING ON CLINICAL CONDITION.   Epifanio Lesches M.D on 06/11/2016 at 8:28 AM  Between 7am to 6pm - Pager - [phone]  After 6pm go to [email or web address] - password [email or web address] [place] [facility]  Office  [phone]  CC: Primary care physician; [facility]

## 2016-06-11 NOTE — Consult Note (Signed)
Consultation Note Date: 06/11/2016   Patient Name: Charlotte Tran  DOB: April 14, 1956  MRN: [id number]  Age / Sex: 60 y.o., female  PCP: [facility] Referring Physician: Epifanio Lesches, MD  Reason for Consultation: Establishing goals of care, Hospice Evaluation, Non pain symptom management, Pain control and Psychosocial/spiritual support  HPI/Patient Profile: 60 y.o. female  admitted on 06/25/2016  with a known history of Hypertension, stage IV breast cancer history of chemotherapy, radiation, left mastectomy 9 years ago comes in with worsening edema of the left hand associated with shortness of breath.   Patient was advised to follow-up with oncologist for further treatment options but she refused any treatment. Patient has stage IV breast cancer and had a treatment in [place]. Moved from [place] 7 years ago.   Patient does not want any further treatments for the breast cancer. And she refused oncology consult also. We are admitting her for acute renal failure. 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 Patient complains of  chronic pain.   Patient is faced with advanced directive decisions and anticipatory care needs. She lived in a boarding house and it is not "fit to return to" (bug infested) per her mother who is here visiting from [place].  She will need different placement.  Clinical Assessment and Goals of Care:  This NP Wadie Lessen reviewed medical records, received report from team, assessed the patient and then meet at the patient's bedside along with her mother and brother/[name]  to discuss diagnosis, prognosis, GOC, EOL wishes disposition and options.  A detailed discussion was had today regarding advanced directives.  Concepts specific to code status, artifical  feeding and hydration, continued IV antibiotics and rehospitalization was had.  The difference between a aggressive medical intervention path  and a palliative comfort care path for this patient at this time was had.  Values and goals of care important to patient and family were attempted to be elicited.  MOST form completed  Concept of Hospice and Palliative Care were discussed  Natural trajectory and expectations at EOL were discussed.  Questions and concerns addressed.   Family encouraged to call with questions or concerns.  PMT will continue to support holistically.  Advanced directives completed and notarized with assistance of Spiritual care   SUMMARY OF RECOMMENDATIONS    Code Status/Advance Care Planning:  DNR    Symptom Management:   Dyspnea/Pain: Roxanol 5 mg po/sl every 1 hr prn  Agitation: Ativan 1 mg PO/sl every 4 hrs prn  Palliative Prophylaxis:   Bowel Regimen, Delirium Protocol, Frequent Pain Assessment and Oral Care  Additional Recommendations (Limitations, Scope, Preferences):  Full Comfort Care  Psycho-social/Spiritual:   Desire for further Chaplaincy support:yes  Additional Recommendations: Education on Hospice  Prognosis:   < 3 months  Discharge Planning: [place] with Hospice      Primary Diagnoses: Present on Admission: . Acute renal failure (ARF) ([place])   I  have reviewed the medical record, interviewed the patient and family, and examined the patient. The following aspects are pertinent.  Past Medical History:  Diagnosis Date  . Asthma   . Cancer ([place])   . Hypertension    Social History   Social History  . Marital status: Single    Spouse name: N/A  . Number of children: N/A  . Years of education: N/A   Social History Main Topics  . Smoking status: Never Smoker  . Smokeless tobacco: Never Used  . Alcohol use Yes  . Drug use: No  . Sexual activity: Not Asked   Other Topics Concern  . None   Social  History Narrative  . None   No family history on file. Scheduled Meds: . azithromycin  500 mg Intravenous Q24H  . cefTRIAXone (ROCEPHIN)  IV  1 g Intravenous Q24H  . docusate sodium  100 mg Oral BID  . enoxaparin (LOVENOX) injection  55 mg Subcutaneous Q12H   Continuous Infusions:  PRN Meds:.acetaminophen **OR** acetaminophen, bisacodyl, ondansetron **OR** ondansetron (ZOFRAN) IV, traMADol, traZODone Medications Prior to Admission:  Prior to Admission medications   Medication Sig Start Date End Date Taking? Authorizing Provider  lisinopril (PRINIVIL,ZESTRIL) 40 MG tablet Take 40 mg by mouth daily.   Yes Historical Provider, MD  potassium chloride (K-DUR) 10 MEQ tablet Take 1 tablet (10 mEq total) by mouth daily. 04/18/16  Yes Delman Kitten, MD  traMADol (ULTRAM) 50 MG tablet Take 1 tablet (50 mg total) by mouth every 6 (six) hours as needed. 04/18/16  Yes Delman Kitten, MD   Allergies  Allergen Reactions  . Aspirin Anaphylaxis  . Codeine Anaphylaxis   Review of Systems  Constitutional: Positive for activity change and appetite change.  Respiratory: Positive for shortness of breath.   Musculoskeletal:       -noted left arm lymphedema     Physical Exam  Constitutional: She is oriented to person, place, and time. She appears cachectic. She appears ill.  Cardiovascular: Tachycardia present.   Pulmonary/Chest: She has decreased breath sounds in the right lower field and the left lower field.  -noted skin changes from radiation on right side of chest wall  Neurological: She is alert and oriented to person, place, and time.  Skin: Skin is warm and dry.    Vital Signs: BP 129/80 (BP Location: Right Arm)   Pulse 100   Temp 98.1 F (36.7 C)   Resp 16   Ht 5\' 6"  (1.676 m)   Wt 54.5 kg (120 lb 1.6 oz)   SpO2 92%   BMI 19.38 kg/m  Pain Assessment: 0-10   Pain Score: 6    SpO2: SpO2: 92 % O2 Device:SpO2: 92 % O2 Flow Rate: .O2 Flow Rate (L/min): 2 L/min  IO: Intake/output  summary:  Intake/Output Summary (Last 24 hours) at 06/11/16 0905 Last data filed at 06/11/16 0820  Gross per 24 hour  Intake              240 ml  Output              450 ml  Net             -210 ml    LBM: Last BM Date: 06/07/16 Baseline Weight: Weight: 60.3 kg (133 lb) Most recent weight: Weight: 54.5 kg (120 lb 1.6 oz)      Palliative Assessment/Data:  40 %    Flowsheet Rows   Flowsheet Row Most Recent Value  Intake Tab  Referral  Department  Hospitalist  Unit at Time of Referral  Oncology Unit  Palliative Care Primary Diagnosis  Cancer  Date Notified  06/25/2016  Palliative Care Type  New Palliative care  Reason for referral  Clarify Goals of Care, Counsel Regarding Hospice  Date of Admission  06/07/2016  # of days IP prior to Palliative referral  0  Clinical Assessment  Psychosocial & Spiritual Assessment  Palliative Care Outcomes     Discussed with Dr Vianne Bulls  Time In: 1300 Time Out: 1415 Time Total: 75 min Greater than 50%  of this time was spent counseling and coordinating care related to the above assessment and plan.  Signed by: Wadie Lessen, NP   Please contact Palliative Medicine Team phone at [phone] for questions and concerns.  For individual provider: See Shea Evans

## 2016-06-11 NOTE — Progress Notes (Signed)
Physical Therapy Treatment Patient Details Name: Charlotte Tran MRN: [id number] DOB: [birth year] Today's Date: 06/11/2016    History of Present Illness Pt is a 60 y/o female admitted with a diagnosis of acute renal failure. Pt brought in by family due to SOB and weakness; she has open wounds to L upper chest and significant swelling of L arm. PMH includes HTN, stage IV breat cancer (chemotherapy, radiation, L mastectomy 9 years ago).     PT Comments    Pt in bed upon arrival requesting to use bathroom.  To edge of bed with min assist for le's.  Transferred to/from  [facility] with min a x 1.  Returned to supine with mod a x 1 for le's.  Sats were not obtained during session as she needed to use restroom quickly.  Pt declined further session due to general fatigue.  Reported SOB did not increase significantly today with transfer.   Follow Up Recommendations  SNF     Equipment Recommendations  Rolling walker with 5" wheels    Recommendations for Other Services       Precautions / Restrictions Precautions Precautions: Fall Restrictions Weight Bearing Restrictions: No    Mobility  Bed Mobility Overal bed mobility: Needs Assistance Bed Mobility: Supine to Sit;Sit to Supine     Supine to sit: Min assist Sit to supine: Mod assist   General bed mobility comments: required increased assist today due to pain  Transfers Overall transfer level: Needs assistance   Transfers: Stand Pivot Transfers min assist Transfers Sit to Stand  Min assist         General transfer comment: Pt moved slowly and directs transfer and writer assist to her comfort and ability  Ambulation/Gait                 Stairs            Wheelchair Mobility    Modified Rankin (Stroke Patients Only)       Balance Overall balance assessment: Needs assistance Sitting-balance support: Feet supported Sitting balance-Leahy Scale: Good     Standing balance support: Single extremity  supported Standing balance-Leahy Scale: Fair                      Cognition Arousal/Alertness: Awake/alert Behavior During Therapy: WFL for tasks assessed/performed Overall Cognitive Status: Within Functional Limits for tasks assessed                      Exercises     General Comments General comments (skin integrity, edema, etc.): open wounds to L upper chest and swelling of UE      Pertinent Vitals/Pain Pain Assessment: 0-10 Pain Score: 7  Pain Location: R LE Pain Descriptors / Indicators: Aching Pain Intervention(s): Limited activity within patient's tolerance    Home Living                      Prior Function            PT Goals (current goals can now be found in the care plan section) Acute Rehab PT Goals Patient Stated Goal: To go to a nursing home or hospice home    Frequency  Min 2X/week    PT Plan Current plan remains appropriate    Co-evaluation             End of Session Equipment Utilized During Treatment: Gait belt;Oxygen Activity Tolerance: Patient limited by fatigue;Patient limited by pain Patient  left: in bed;with call bell/phone within reach;with bed alarm set     Time: [id number] PT Time Calculation (min) (ACUTE ONLY): 13 min  Charges:  $Therapeutic Exercise: 8-22 mins $Therapeutic Activity: 8-22 mins                    G Codes:      Chesley Noon, PTA 06/11/16, 10:43 AM

## 2016-06-11 NOTE — Progress Notes (Signed)
RECOMMENDATION: This patient is receiving azithromycin by the intravenous route.  Based on criteria approved by the Pharmacy and Therapeutics Committee, the antibiotic(s) is/are being converted to the equivalent oral dose form(s).   DESCRIPTION: These criteria include:  Patient being treated for a respiratory tract infection, urinary tract infection, cellulitis or clostridium difficile associated diarrhea if on metronidazole  The patient is not neutropenic and does not exhibit a GI malabsorption state  The patient is eating (either orally or via tube) and/or has been taking other orally administered medications for a least 24 hours  The patient is improving clinically and has a Tmax < 100.5  If you have questions about this conversion, please contact the Pharmacy Department  []   [phone] )  [facility] [x]   [phone] )  [facility] []   [phone] )  [facility] []   [phone] )  [facility] []   [phone] )  [facility]

## 2016-06-11 NOTE — Clinical Social Work Note (Signed)
Clinical Social Work Assessment  Patient Details  Name: Charlotte Tran MRN: [id number] Date of Birth: [birth year]  Date of referral:  06/11/16               Reason for consult:  Discharge Planning                Permission sought to share information with:  Family Supports Permission granted to share information::     Name::        Agency::     Relationship::   (Charlotte Tran- Brother)  Sport and exercise psychologist Information:     Housing/Transportation Living arrangements for the past 2 months:   Designer, industrial/product) Source of Information:  Patient Sales executive- Brother) Patient Interpreter Needed:  None Criminal Activity/Legal Involvement Pertinent to Current Situation/Hospitalization:  No - Comment as needed Significant Relationships:  Siblings Lives with:  Other (Comment) [employer]) Do you feel safe going back to the place where you live?  No (Patient reports that her living arrangements are terrible. Reported that the boarding house is not clean and they don't take care of her. Patient's brother confirmed and reported he is goign to call the "state" on the boarding house.) Need for family participation in patient care:  Yes (Comment) (Charlotte Tran- Brother)  Care giving concerns:  Patient and her family reports that she cannot return to the Boarding house due to the living conditions. Reported that they are interested in LTC or ALF for patient.    Social Worker assessment / plan:  CSW was told by [place] NP Stanton Kidney that patient has decided that she would like to be total comfort care and that she cannot care for herself. Per Palliative Care NP [facility] patient would be appropriate for LTC or ALF level of care. CSW met with patient, brother- Charlotte Tran and Charlotte Tran at bedside. Introduced herself and her role. Per patient's family they do not want patient to return to the boarding house she was living in. They report they live in [place] and did not know patient was living in "terrible conditions". Reported they are going  to make a report to "The state" because her living arrangements were "unacceptable". Patient informed CSW that she's changed to comfort care. She reported that she'd like to go to a SNF or ALF. CSW informed patient of the process of SNF referrals and informed patient that she would have to send a SNF/ ALF referral to SNFs throughout [place] to determine if they have available Medicaid beds. Patient granted CSW verbal permission to send SNF and ALF referrals to facilities in [place]. FL2/ PASRR completed, placed in MDs basket for cosign and faxed to facilities in [place]. Awaiting bed offers.   CSW informed patient and her family that if a facility isn't available patient may have to return to the boarding house or she could go live with family. The family reports they will "cross the bridge" if they have to.   Employment status:  Retired Forensic scientist:  Medicaid In [place] PT Recommendations:  [place] / Referral to community resources:  [place], Other (Comment Required) (ALF)  Patient/Family's Response to care:  Patient's family reports that they appreciate CSW's assistance.   Patient/Family's Understanding of and Emotional Response to Diagnosis, Current Treatment, and Prognosis:  Patient and her family reports that they understand patient's Diagnosis, Current Treatment, and Prognosis and made the decision for patient to be under comfort care measures.   Emotional Assessment Appearance:  Appears stated age Attitude/Demeanor/Rapport:   (None) Affect (  typically observed):  Accepting, Calm, Pleasant Orientation:  Oriented to Self, Oriented to Place, Oriented to  Time, Oriented to Situation Alcohol / Substance use:  Not Applicable Psych involvement (Current and /or in the community):  No (Comment)  Discharge Needs  Concerns to be addressed:  Discharge Planning Concerns Readmission within the last 30 days:  No Current discharge risk:  Chronically  ill Barriers to Discharge:  Continued Medical Work up   [employer], LCSW 06/11/2016, 3:40 PM

## 2016-06-11 NOTE — Progress Notes (Signed)
Nutrition Brief Note  Patient identified on the Malnutrition Screening Tool (MST) Report  Wt Readings from Last 15 Encounters:  06/11/16 120 lb 1.6 oz (54.5 kg)  04/18/16 120 lb (54.4 kg)  02/01/16 140 lb (63.5 kg)   Charlotte Tran  is a 60 y.o. female with a known history of Hypertension, stage IV breast cancer history of chemotherapy, radiation, left mastectomy 9 years ago comes in with worsening edema of the left hand associated with shortness of breath. Patient was advised to follow-up with oncologist for further treatment options but she refused. Any treatment. Patient has stage IV breast cancer and had a treatment in [place]. Moved from [place] 7 years ago. Patient does not want any further treatments for the breast cancer  Chart reviewed. 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 Palliative care consult is pending. Pt is requesting residential hospice placement. Suspect malnutrition with continued nutritional decline.   Body mass index is 19.38 kg/m. Patient meets criteria for normal weight range based on current BMI.   Current diet order is regular, patient is consuming approximately 0-100% of meals at this time. Labs and medications reviewed.   No nutrition interventions warranted at this time. If nutrition issues arise, please consult RD.   Charlotte Tran A. Jimmye Norman, RD, LDN, CDE Pager: [phone] After hours Pager: [phone]

## 2016-06-11 NOTE — [place] FL2 (Signed)
[place] LEVEL OF CARE SCREENING TOOL     IDENTIFICATION  Patient Name: Charlotte Tran Birthdate: 1955/11/15 Sex: female Admission Date (Current Location): 06/27/2016  [facility] and [place] Number:  Charlotte Tran  ([id number]) Facility and Address:  [facility], [address] [place], [place] [address]      Provider Number: [id number]  Attending Physician Name and Address:  Epifanio Lesches, MD  Relative Name and Phone Number:       Current Level of Care: Hospital Recommended Level of Care: [place] Prior Approval Number:    Date Approved/Denied:   PASRR Number:  ([id number])  Discharge Plan: SNF    Current Diagnoses: Patient Active Problem List   Diagnosis Date Noted  . Acute renal failure (ARF) ([place]) 06/29/2016  . Pressure ulcer 06/28/2016    Orientation RESPIRATION BLADDER Height & Weight     Self, Time, Situation, Place  O2 (Nasal Cannula 2 (L/min) ) Continent Weight: 120 lb 1.6 oz (54.5 kg) Height:  5\' 6"  (167.6 cm)  BEHAVIORAL SYMPTOMS/MOOD NEUROLOGICAL BOWEL NUTRITION STATUS   (None)  (None) Continent Diet (Regular )  AMBULATORY STATUS COMMUNICATION OF NEEDS Skin   Extensive Assist   Other (Comment) (Pressure Ulcer Stage II Buttocks; Pressure Ulcer Stage II Medial)                       Personal Care Assistance Level of Assistance  Bathing, Feeding, Dressing Bathing Assistance: Limited assistance Feeding assistance: Independent Dressing Assistance: Limited assistance     Functional Limitations Info  Sight, Hearing, Speech Sight Info: Adequate Hearing Info: Adequate Speech Info: Adequate    SPECIAL CARE FACTORS FREQUENCY                       Contractures      Additional Factors Info  Code Status, Allergies Code Status Info:  (DNR) Allergies Info:  (Aspirin & Codeine)           Current Medications (06/11/2016):  This is the current hospital active medication list Current  Facility-Administered Medications  Medication Dose Route Frequency Provider Last Rate Last Dose  . acetaminophen (TYLENOL) tablet 650 mg  650 mg Oral Q6H PRN Epifanio Lesches, MD       Or  . acetaminophen (TYLENOL) suppository 650 mg  650 mg Rectal Q6H PRN Epifanio Lesches, MD      . azithromycin (ZITHROMAX) tablet 500 mg  500 mg Oral Q24H Epifanio Lesches, MD      . bisacodyl (DULCOLAX) EC tablet 5 mg  5 mg Oral Daily PRN Epifanio Lesches, MD   5 mg at 06/11/16 1203  . cefTRIAXone (ROCEPHIN) 1 g in dextrose 5 % 50 mL IVPB  1 g Intravenous Q24H Epifanio Lesches, MD   1 g at 06/10/16 1654  . docusate sodium (COLACE) capsule 100 mg  100 mg Oral BID Epifanio Lesches, MD   100 mg at 06/11/16 1203  . enoxaparin (LOVENOX) injection 55 mg  55 mg Subcutaneous Q12H Epifanio Lesches, MD      . lisinopril (PRINIVIL,ZESTRIL) tablet 40 mg  40 mg Oral Daily Epifanio Lesches, MD   40 mg at 06/11/16 1524  . morphine CONCENTRATE 10 MG/0.5ML oral solution 5 mg  5 mg Oral Q2H PRN Knox Royalty, NP   5 mg at 06/11/16 1525  . ondansetron (ZOFRAN) tablet 4 mg  4 mg Oral Q6H PRN Epifanio Lesches, MD       Or  .  ondansetron (ZOFRAN) injection 4 mg  4 mg Intravenous Q6H PRN Epifanio Lesches, MD      . polyethylene glycol (MIRALAX / GLYCOLAX) packet 17 g  17 g Oral Daily Epifanio Lesches, MD      . traMADol (ULTRAM) tablet 50 mg  50 mg Oral Q6H PRN Epifanio Lesches, MD   50 mg at 06/11/16 1203  . traZODone (DESYREL) tablet 25 mg  25 mg Oral QHS PRN Epifanio Lesches, MD         Discharge Medications: Please see discharge summary for a list of discharge medications.  Relevant Imaging Results:  Relevant Lab Results:   Additional Information  (SSN [id number])  Charlotte Quarry Shaylah Maddy, Charlotte Tran

## 2016-06-12 DIAGNOSIS — Z515 Encounter for palliative care: Secondary | ICD-10-CM

## 2016-06-12 MED ORDER — MORPHINE SULFATE (PF) 2 MG/ML IV SOLN
2.0000 mg | INTRAVENOUS | Status: DC | PRN
  Administered 2016-06-12: 09:00:00 2 mg via INTRAVENOUS
  Filled 2016-06-12: qty 1

## 2016-06-12 MED ORDER — BENZONATATE 100 MG PO CAPS
100.0000 mg | ORAL_CAPSULE | Freq: Three times a day (TID) | ORAL | Status: DC | PRN
  Administered 2016-06-12: 04:00:00 100 mg via ORAL
  Filled 2016-06-12: qty 1

## 2016-06-12 MED ORDER — FUROSEMIDE 10 MG/ML IJ SOLN
40.0000 mg | Freq: Once | INTRAMUSCULAR | Status: AC
  Administered 2016-06-12: 08:00:00 40 mg via INTRAVENOUS
  Filled 2016-06-12: qty 4

## 2016-06-12 MED ORDER — LORAZEPAM 2 MG/ML IJ SOLN: 2.0000 mg | INTRAMUSCULAR | Status: DC | PRN

## 2016-06-12 MED ORDER — LORAZEPAM 2 MG/ML IJ SOLN: 1.0000 mg | INTRAMUSCULAR | Status: DC | PRN

## 2016-06-12 MED ORDER — METHYLPREDNISOLONE SODIUM SUCC 125 MG IJ SOLR
125.0000 mg | Freq: Once | INTRAMUSCULAR | Status: AC
  Administered 2016-06-12: 08:00:00 125 mg via INTRAVENOUS
  Filled 2016-06-12: qty 2

## 2016-07-04 NOTE — Discharge Summary (Signed)
    Charlotte Tran, is a 60 y.o. female  DOB [birth year]  MRN [id number].  Admission date:  06/04/2016  Admitting Physician  Charlotte Lesches, MD  Discharge Date:  06/15/2016   Primary MD  Charlotte Tran  Recommendations for primary care physician for things to follow:   pt expired   Admission Diagnosis  Shortness of breath [R06.02] Lymphedema [I89.0] Skin lesion of chest wall [L98.9] Acute renal failure, unspecified acute renal failure type (Charlotte Tran) [N17.9]   Active Problems:   Acute renal failure (Charlotte Tran)   Pressure ulcer   Palliative care encounter   DNR (do not resuscitate)   Metastatic breast cancer (Charlotte Tran)   Lymphedema   Shortness of breath      Past Medical History:  Diagnosis Date  . Asthma   . Cancer ([place])   . Hypertension     Past Surgical History:  Procedure Laterality Date  . BREAST SURGERY         History of present illness and  Hospital Course:     Kindly see H&P for history of present illness and admission details, please review complete Labs, Consult reports and Test reports for all details in brief  HPI  from the history and physical done on the day of admission  60 yr old metastatic breast cancer  Admitted for SOB,increased left arm edema,UTI,acute renal failure  Hospital Course /death summary  1.acute renal  failure due to dehydration,ATN;improved with IV fluids  2.SOB,tachycardia;;possible PE  ,due to h/o metastatic breast CA;unable to do CT angio chest because of renal failure;pt  Did not want aggressive care,and wanted to be comfortable.she refused  Oncology consult also 3.metastatic breast cancer ;seen by  Palliative care team;pt started on comfort care with roxanol,ativan,condition drastically declined and  Pt pronounced  dead on 06-29-2016 at 9.25 am. Cause of  death;metastatic breast Cancer 2.acute renal failure Acute respiratory failure  Total Time in preparing paper work, data evaluation and todays exam - 21 minutes  Charlotte Tran M.D on 06/15/2016 at 8:09 AM    Note: This dictation was prepared with Dragon dictation along with smaller phrase technology. Any transcriptional errors that result from this process are unintentional.

## 2016-07-04 NOTE — Progress Notes (Signed)
[facility] NAME: Charlotte Tran    MR#:  [id number]  DATE OF BIRTH:  06-Nov-1955  SUBJECTIVE; patient has some gurgling and the audible wheezing unable to talk in sentences. started on now Roxanol, Ativan by palliative care team after discussing with patient,yesterday. But she is not made comfort care as of this morning. Asked  the patient if she wants  Only comfort care , she says she cannot talk told her we can give her Lasix, Solu-Medrol and after that she can now have a conversation with me about making her full comfort care. By ticks or stop it yesterday but she is remained on now blood pressure pills.   CHIEF COMPLAINT:   Chief Complaint  Patient presents with  . Cellulitis    REVIEW OF SYSTEMS:    Review of Systems  Constitutional: Negative for chills, fever and weight loss.  HENT: Negative for ear pain and hearing loss.   Eyes: Negative for blurred vision, double vision and photophobia.  Respiratory: Positive for shortness of breath and wheezing. Negative for cough, hemoptysis and sputum production.   Cardiovascular: Negative for chest pain, palpitations, orthopnea, claudication, leg swelling and PND.  Gastrointestinal: Negative for abdominal pain, diarrhea, heartburn, nausea and vomiting.  Genitourinary: Negative for dysuria, frequency, hematuria and urgency.  Musculoskeletal: Negative for back pain, falls, joint pain, myalgias and neck pain.       Left arm edema present  Neurological: Negative for dizziness, tingling, tremors, sensory change, speech change and headaches.  Endo/Heme/Allergies: Negative for environmental allergies. Does not bruise/bleed easily.  Psychiatric/Behavioral: Negative for depression, hallucinations, substance abuse and suicidal ideas. The patient is not nervous/anxious and does not have insomnia.     Nutrition: Tolerating Diet: Tolerating PT:      DRUG ALLERGIES:   Allergies  Allergen  Reactions  . Aspirin Anaphylaxis  . Codeine Other (See Comments)    Makes pt see things    VITALS:  Blood pressure (!) 164/89, pulse (!) 117, temperature 98.4 F (36.9 C), temperature source Oral, resp. rate 18, height 5\' 6"  (1.676 m), weight 54.5 kg (120 lb 1.6 oz), SpO2 90 %.  PHYSICAL EXAMINATION:   Physical Exam  GENERAL:  60 y.o.-year-old patient lying in the bed With acute respiratory distress with audible wheezing, gargling unable to talk. Full sentences. Marland Kitchen EYES: Pupils equal, round, reactive to light and accommodation. No scleral icterus. Extraocular muscles intact.  HEENT: Head atraumatic, normocephalic. Oropharynx and nasopharynx clear.  NECK:  Supple, no jugular venous distention. No thyroid enlargement, no tenderness.  LUNGS: Diffuse bilateral wheezing, basilar crepitations present using extra muscles of respiration.  CARDIOVASCULAR: S1, S2 normal. No murmurs, rubs, or gallops.  ABDOMEN: Soft, nontender, nondistended. Bowel sounds present. No organomegaly or mass.  EXTREMITIEe;Left  edema present NEUROLOGIC: Cranial nerves II through XII are intact. Muscle strength 5/5 in all extremities. Sensation intact. Gait not checked.  PSYCHIATRIC: The patient is alert and oriented x 3.  SKIN: No obvious rash, lesion, or ulcer.    LABORATORY PANEL:   CBC  Recent Labs Lab 06/10/16 0427  WBC 5.8  HGB 13.3  HCT 39.5  PLT 120*   ------------------------------------------------------------------------------------------------------------------  Chemistries   Recent Labs Lab 07/03/2016 1223 06/10/16 0427  NA 138 142  K 3.8 4.1  CL 104 111  CO2 19* 19*  GLUCOSE 119* 83  BUN 48* 41*  CREATININE 2.08* 1.37*  CALCIUM 9.9 9.2  AST 138*  --   ALT 57*  --  ALKPHOS 287*  --   BILITOT 0.9  --    ------------------------------------------------------------------------------------------------------------------  Cardiac Enzymes  Recent Labs Lab 06/08/2016 1223  TROPONINI  0.04*   ------------------------------------------------------------------------------------------------------------------  RADIOLOGY:  No results found.   ASSESSMENT AND PLAN:   Active Problems:   Acute renal failure (HCC)   Pressure ulcer   Palliative care encounter   DNR (do not resuscitate)   Metastatic breast cancer (HCC)   Lymphedema   Shortness of breath   #1 acute renal failure due to ATN: Improved with IV hydration. #2 . Respiratory  distress with wheezing: give  1 dose Lasix, Solu-Medrol. Patient expressed wish to be comfortable on comfort care measures yesterday ,comfort care only order not there .Marland Kitchen  Continue Roxanol, Ativan, according to the area from Palliative care patient to does not want blood work, Levaquin, further treatments. We'll confirm the same today and place comfort care orders.  #3 history of metastatic breast cancer: Patient refused the oncology follow up. No   prognosis  poor, high risk for cardiac arrest.DNR NMP  For UTI, pneumonia: On Rocephin, Zithromax. Follow urine cultures.  All the records are reviewed and case discussed with Care Management/Social Workerr. Management plans discussed with the patient, family and they are in agreement.  CODE STATUS: DNR  TOTAL TIME TAKING CARE OF THIS PATIENT: 35 minutes.   POSSIBLE D/C IN 1-2 daAYS, DEPENDING ON CLINICAL CONDITION.   Epifanio Lesches M.D on 07-07-16 at 8:00 AM  Between 7am to 6pm - Pager - [phone]  After 6pm go to [email or web address] - password [email or web address]  Office  [phone]  CC: Primary care physician; [facility]

## 2016-07-04 NOTE — Progress Notes (Signed)
Daily Progress Note   Patient Name: Charlotte Tran       Date: Jul 03, 2016 DOB: [birth year]  Age: 60 y.o. MRN#: [id number] Attending Physician: Epifanio Lesches, MD Primary Care Physician: [facility] Admit Date: 06/22/2016  Reason for Consultation/Follow-up: Establishing goals of care, Non pain symptom management, Pain control and Psychosocial/spiritual support  Subjective:  - patient has drastically declined since yesterday, appears to be actively dying  -able to communicate that she does not want family called, emotional support offered,chaplain at bedsdie  Length of Stay: 3  Current Medications: Scheduled Meds:  . docusate sodium  100 mg Oral BID  . polyethylene glycol  17 g Oral Daily    Continuous Infusions:    PRN Meds: acetaminophen **OR** acetaminophen, bisacodyl, LORazepam, LORazepam, morphine injection, morphine CONCENTRATE, ondansetron **OR** ondansetron (ZOFRAN) IV  Physical Exam          Vital Signs: BP (!) 164/89 (BP Location: Right Arm)   Pulse (!) 117   Temp 98.4 F (36.9 C) (Oral)   Resp 18   Ht 5\' 6"  (1.676 m)   Wt 54.5 kg (120 lb 1.6 oz)   SpO2 90%   BMI 19.38 kg/m  SpO2: SpO2: 90 % O2 Device: O2 Device: Nasal Cannula O2 Flow Rate: O2 Flow Rate (L/min): 5 L/min  Intake/output summary:  Intake/Output Summary (Last 24 hours) at 2016/07/03 0842 Last data filed at 07-03-2016 0801  Gross per 24 hour  Intake                0 ml  Output              400 ml  Net             -400 ml   LBM: Last BM Date: 06/22/2016 Baseline Weight: Weight: 60.3 kg (133 lb) Most recent weight: Weight: 54.5 kg (120 lb 1.6 oz)       Palliative Assessment/Data:   20%    Flowsheet Rows   Flowsheet Row Most Recent Value  Intake Tab  Referral  Department  Hospitalist  Unit at Time of Referral  Oncology Unit  Palliative Care Primary Diagnosis  Cancer  Date Notified  06/27/2016  Palliative Care Type  New Palliative care  Reason for referral  Clarify Goals of Care, Counsel Regarding Hospice  Date of  Admission  07/02/2016  # of days IP prior to Palliative referral  0  Clinical Assessment  Psychosocial & Spiritual Assessment  Palliative Care Outcomes      Patient Active Problem List   Diagnosis Date Noted  . Palliative care encounter 06/11/2016  . DNR (do not resuscitate) 06/11/2016  . Metastatic breast cancer ([place]) 06/11/2016  . Lymphedema   . Shortness of breath   . Acute renal failure ([place]) 06/18/2016  . Pressure ulcer 06/11/2016    Palliative Care Assessment & Plan    Assessment: -metastatic breast cancer, focus is full comfort, actively dying   Goals of Care and Additional Recommendations:  Limitations on Scope of Treatment: Full Comfort Care   Discussed use of medication to enhance comfort with nursing, changed route to IV  Code Status:    Code Status Orders        Start     Ordered   07/06/16 0841  Do not attempt resuscitation (DNR)  Continuous    Question Answer Comment  In the event of cardiac or respiratory ARREST Do not call a "code blue"   In the event of cardiac or respiratory ARREST Do not perform Intubation, CPR, defibrillation or ACLS   In the event of cardiac or respiratory ARREST Use medication by any route, position, wound care, and other measures to relive pain and suffering. May use oxygen, suction and manual treatment of airway obstruction as needed for comfort.      07/06/16 0840    Code Status History    Date Active Date Inactive Code Status Order ID Comments User Context   06/10/2016  8:48 AM Jul 06, 2016  8:40 AM DNR [id number]  Epifanio Lesches, MD Inpatient   06/03/2016  3:22 PM 06/10/2016  8:48 AM Full Code [id number]  Epifanio Lesches, MD ED       Prognosis:   Hours -  Days  Discharge Planning:  Anticipated Hospital Death  Care plan was discussed with Dr Vianne Bulls  Thank you for allowing the Palliative Medicine Team to assist in the care of this patient.   Time In: 0730 Time Out: 0805 Total Time 35 min Prolonged Time Billed  no       Greater than 50%  of this time was spent counseling and coordinating care related to the above assessment and plan.  Wadie Lessen, NP  Please contact Palliative Medicine Team phone at [phone] for questions and concerns.

## 2016-07-04 NOTE — Progress Notes (Signed)
Upon entering pt's room; pt DNR/NMP; pt without respirations and cardiac pulse; pt pronounced by self and M. Sinwany RN; Dr Vianne Bulls, A. Lynford Humphrey, COP[facility] all contacted via telephone calls per policy; pt's brother called to advise of change in pt's condition [phone], on his way to the hospital

## 2016-07-04 NOTE — Progress Notes (Signed)
PT Cancellation Note  Patient Details Name: Charlotte Tran MRN: [id number] DOB: 1955-11-21   Cancelled Treatment:    Reason Eval/Treat Not Completed: Medical issues which prohibited therapy   Chesley Noon 11-Jul-2016, 8:17 AM

## 2016-07-04 DEATH — deceased

## 2017-03-15 IMAGING — CR DG CHEST 2V
1 series · 2 of 2 positions shown · non-contrast
Comparison: 04/18/2016.

CLINICAL DATA: Open wound some left upper chest area was severe
swelling the left all arm.

EXAM:
CHEST  2 VIEW

[Series 1: dg chest 2 view · 0.14mm/px · 2 of 2 slices shown]
[im 1/2]
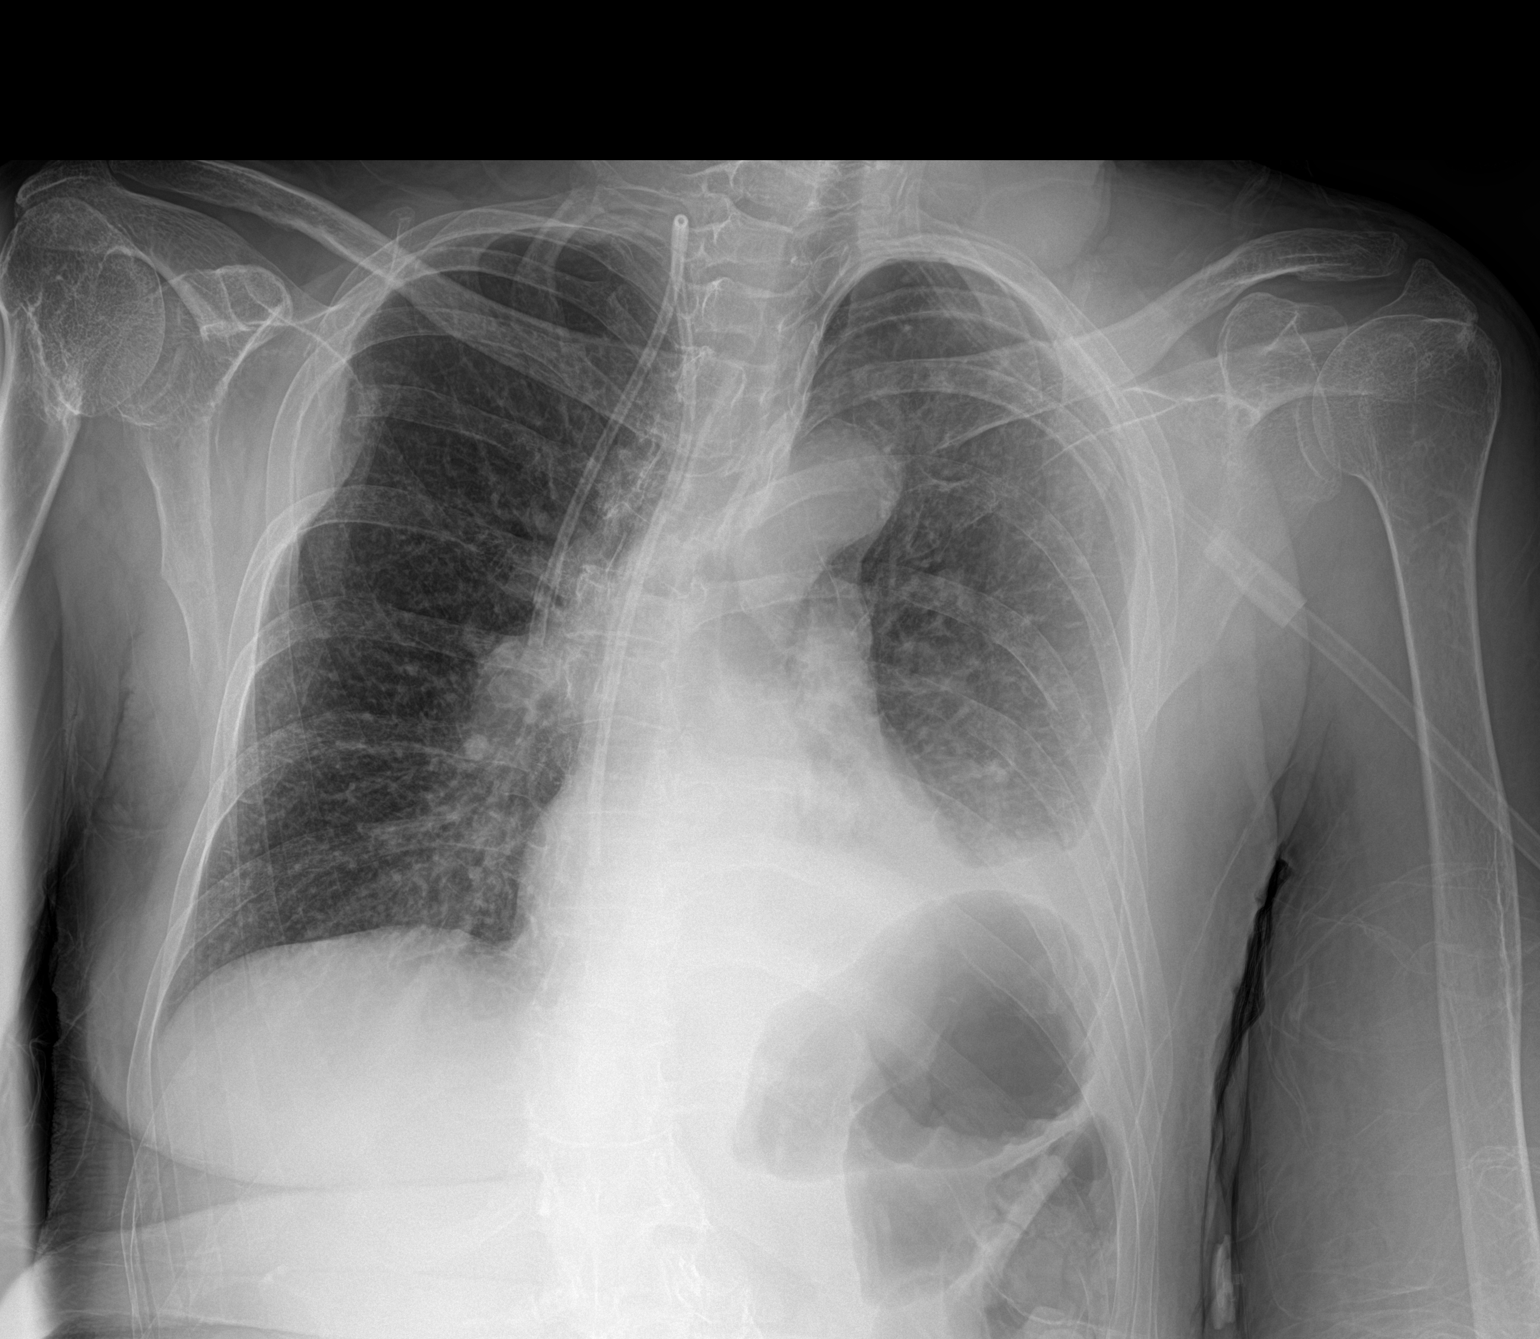
[im 2/2]
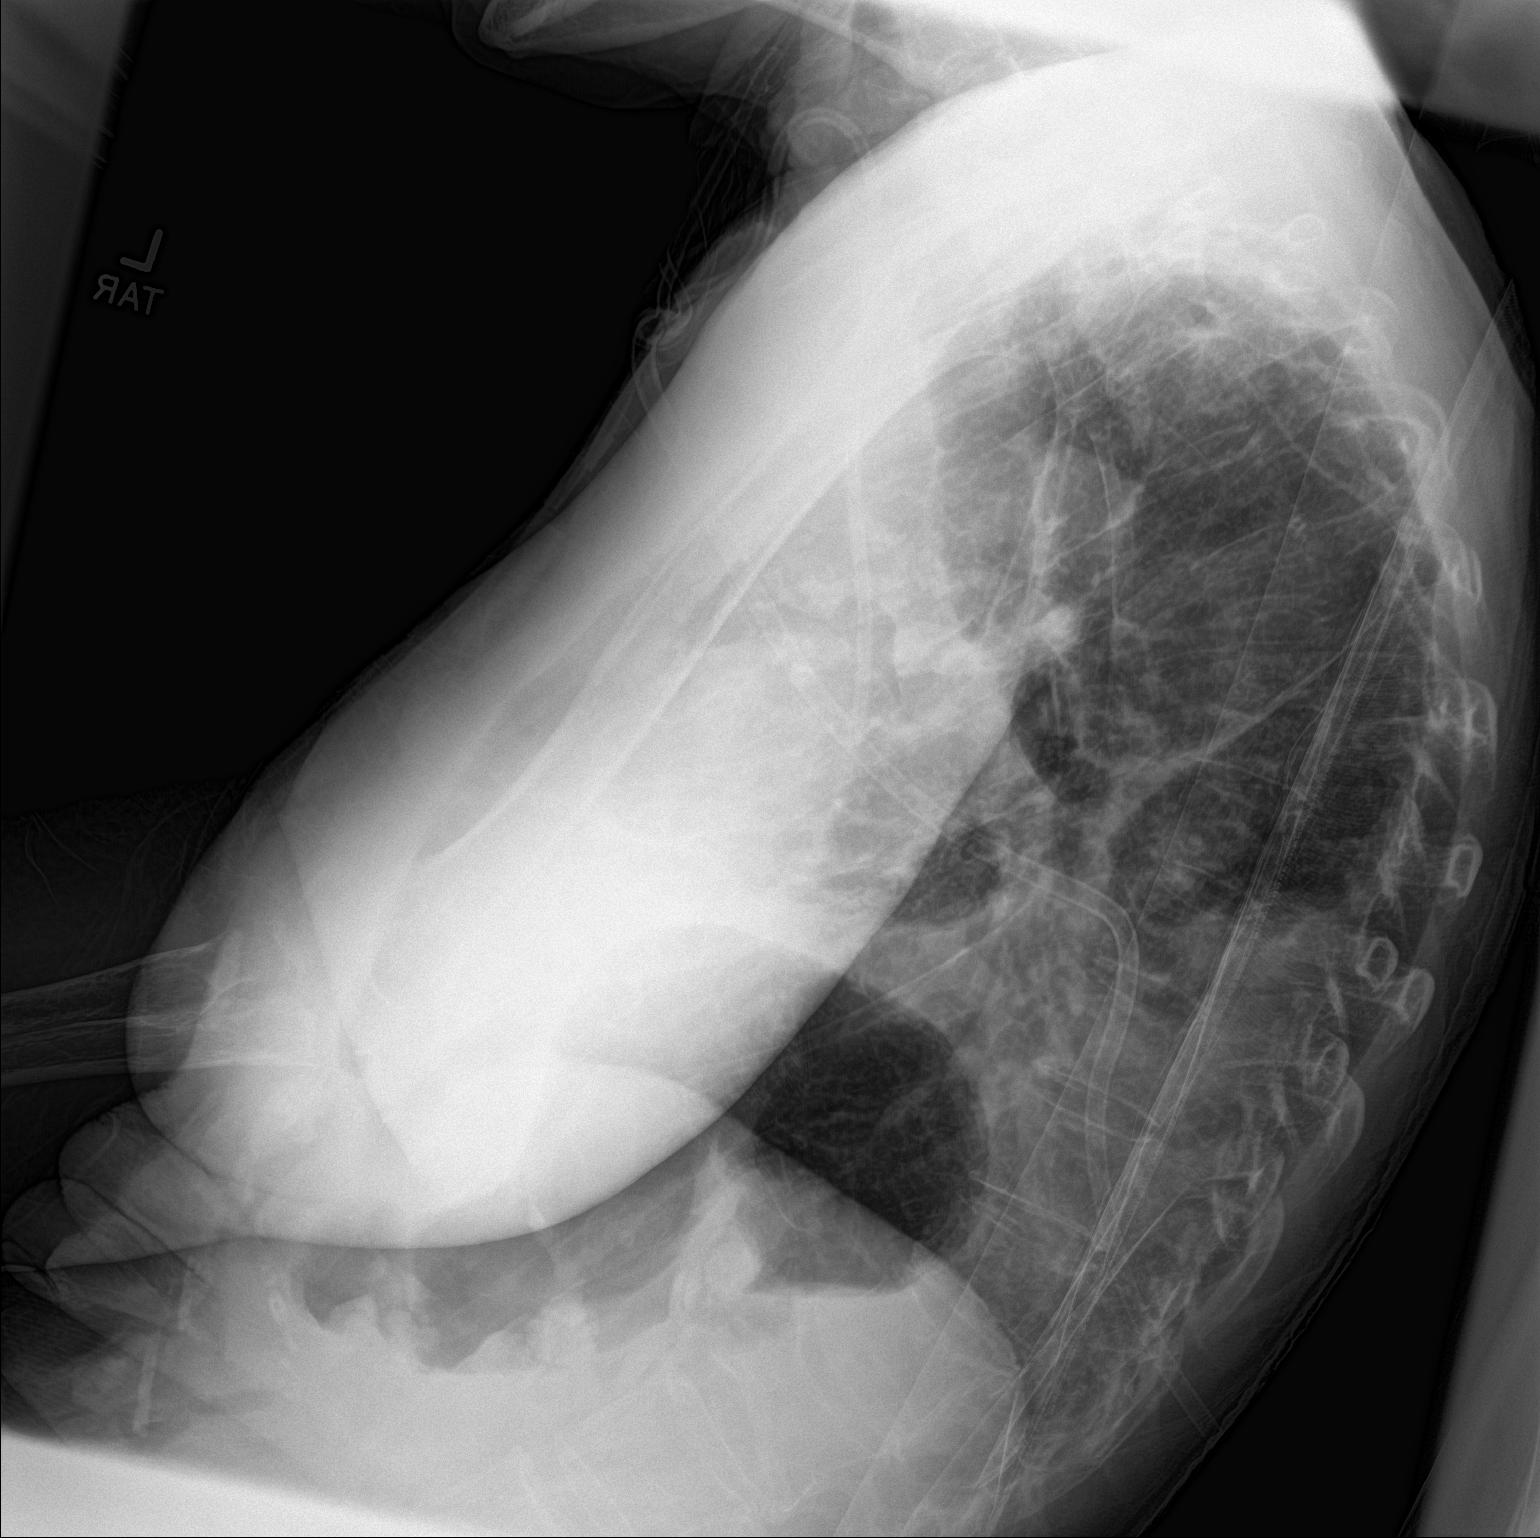

[2 of 2 positions shown; findings below may reference images not displayed]

FINDINGS: Interval increase in left base collapse/ consolidation with small
left pleural effusion a evident. Right lung remains clear. There is
hyperexpansion with underlying chronic interstitial changes
suggesting emphysema. Right Port-A-Cath tip overlies the upper right
atrium. Bones are diffusely demineralized. As seen previously,
posterior right sixth rib is missing with associated masslike
overlying soft tissue attenuation.
IMPRESSION: 1. Left base collapse/consolidation with small left pleural
effusion, new in the interval.
2. Apparent destruction with soft tissue opacity involving the
posterior right sixth rib. Neoplastic process is a concern.
